# Patient Record
Sex: Female | Born: 1950 | Race: White | Hispanic: No | Marital: Married | Smoking: Never smoker
Health system: Southern US, Community
[De-identification: ages and names within clinical notes are randomized; demographics above are authoritative.]

## PROBLEM LIST (undated history)

## (undated) DIAGNOSIS — G4700 Insomnia, unspecified: Secondary | ICD-10-CM

## (undated) DIAGNOSIS — E785 Hyperlipidemia, unspecified: Secondary | ICD-10-CM

## (undated) HISTORY — DX: Insomnia, unspecified: G47.00

## (undated) HISTORY — DX: Hyperlipidemia, unspecified: E78.5

## (undated) HISTORY — PX: FACIAL COSMETIC SURGERY: SHX629

## (undated) HISTORY — PX: CATARACT EXTRACTION: SUR2

## (undated) HISTORY — PX: ABDOMINAL HYSTERECTOMY: SHX81

## (undated) HISTORY — PX: BREAST BIOPSY: SHX20

## (undated) HISTORY — PX: BUNIONECTOMY: SHX129

## (undated) HISTORY — PX: PARS PLANA VITRECTOMY W/ REPAIR OF MACULAR HOLE: SHX2170

---

## 2008-06-06 LAB — HM DEXA SCAN

## 2017-04-03 LAB — HM COLONOSCOPY

## 2017-08-22 DIAGNOSIS — Z961 Presence of intraocular lens: Secondary | ICD-10-CM | POA: Insufficient documentation

## 2017-08-22 DIAGNOSIS — H35341 Macular cyst, hole, or pseudohole, right eye: Secondary | ICD-10-CM | POA: Insufficient documentation

## 2018-09-13 LAB — LIPID PANEL
Cholesterol: 196 (ref 0–200)
HDL: 84 — AB (ref 35–70)
LDL Cholesterol: 99
Triglycerides: 66 (ref 40–160)

## 2018-09-13 LAB — HEPATIC FUNCTION PANEL
ALT: 19 (ref 7–35)
AST: 17 (ref 13–35)

## 2018-09-13 LAB — BASIC METABOLIC PANEL
BUN: 26 — AB (ref 4–21)
Creatinine: 0.6 (ref 0.5–1.1)

## 2018-09-13 LAB — CBC AND DIFFERENTIAL
HCT: 41 (ref 36–46)
Hemoglobin: 13.7 (ref 12.0–16.0)
Platelets: 277 (ref 150–399)
WBC: 6.8

## 2019-01-07 ENCOUNTER — Other Ambulatory Visit: Payer: Self-pay | Admitting: Internal Medicine

## 2019-01-07 DIAGNOSIS — Z1231 Encounter for screening mammogram for malignant neoplasm of breast: Secondary | ICD-10-CM

## 2019-02-07 ENCOUNTER — Other Ambulatory Visit: Payer: Self-pay

## 2019-02-07 ENCOUNTER — Encounter (INDEPENDENT_AMBULATORY_CARE_PROVIDER_SITE_OTHER): Payer: Self-pay

## 2019-02-07 ENCOUNTER — Ambulatory Visit
Admission: RE | Admit: 2019-02-07 | Discharge: 2019-02-07 | Disposition: A | Discharge status: Home / Self Care | Payer: Medicare Other | Source: Ambulatory Visit | Attending: Internal Medicine | Admitting: Internal Medicine

## 2019-02-07 DIAGNOSIS — Z1231 Encounter for screening mammogram for malignant neoplasm of breast: Secondary | ICD-10-CM | POA: Diagnosis not present

## 2019-08-06 ENCOUNTER — Encounter: Payer: Self-pay | Admitting: Internal Medicine

## 2019-08-09 ENCOUNTER — Ambulatory Visit (INDEPENDENT_AMBULATORY_CARE_PROVIDER_SITE_OTHER): Payer: Medicare Other | Admitting: Internal Medicine

## 2019-08-09 ENCOUNTER — Other Ambulatory Visit: Payer: Self-pay

## 2019-08-09 ENCOUNTER — Encounter: Payer: Self-pay | Admitting: Internal Medicine

## 2019-08-09 ENCOUNTER — Ambulatory Visit
Admission: RE | Admit: 2019-08-09 | Discharge: 2019-08-09 | Disposition: A | Discharge status: Home / Self Care | Payer: Medicare Other | Attending: Internal Medicine | Admitting: Internal Medicine

## 2019-08-09 ENCOUNTER — Ambulatory Visit
Admission: RE | Admit: 2019-08-09 | Discharge: 2019-08-09 | Disposition: A | Discharge status: Home / Self Care | Payer: Medicare Other | Source: Ambulatory Visit | Attending: Internal Medicine | Admitting: Internal Medicine

## 2019-08-09 ENCOUNTER — Other Ambulatory Visit: Payer: Self-pay | Admitting: Internal Medicine

## 2019-08-09 VITALS — BP 154/92 | HR 80 | Ht <= 58 in | Wt 123.0 lb

## 2019-08-09 DIAGNOSIS — R030 Elevated blood-pressure reading, without diagnosis of hypertension: Secondary | ICD-10-CM

## 2019-08-09 DIAGNOSIS — M180 Bilateral primary osteoarthritis of first carpometacarpal joints: Secondary | ICD-10-CM | POA: Insufficient documentation

## 2019-08-09 DIAGNOSIS — N3281 Overactive bladder: Secondary | ICD-10-CM | POA: Insufficient documentation

## 2019-08-09 DIAGNOSIS — R3915 Urgency of urination: Secondary | ICD-10-CM | POA: Diagnosis not present

## 2019-08-09 DIAGNOSIS — E782 Mixed hyperlipidemia: Secondary | ICD-10-CM | POA: Insufficient documentation

## 2019-08-09 DIAGNOSIS — M5126 Other intervertebral disc displacement, lumbar region: Secondary | ICD-10-CM | POA: Diagnosis not present

## 2019-08-09 LAB — POCT URINALYSIS DIPSTICK
Bilirubin, UA: NEGATIVE
Blood, UA: NEGATIVE
Glucose, UA: NEGATIVE
Ketones, UA: NEGATIVE
Leukocytes, UA: NEGATIVE
Nitrite, UA: NEGATIVE
Protein, UA: NEGATIVE
Spec Grav, UA: 1.015 (ref 1.010–1.025)
Urobilinogen, UA: 0.2 E.U./dL
pH, UA: 5 (ref 5.0–8.0)

## 2019-08-09 NOTE — Progress Notes (Signed)
Date:  08/09/2019   Name:  Erin Yu   DOB:  [birth year]   MRN:  [id number]   Chief Complaint: Establish Care, Joint Pain (Joint pains over body. Wrists are the worst. X 1 year. Hand splint helped in past but last 2 months has been worse. ), Urinary Urgency (X 2 months. Sometimes cannot make it to the bathroom on time. ), and Armpit pain (Right armpit lymph node is painful x 1 week.)  Wrist Pain  The pain is present in the left wrist and right wrist (at the base of the thumbs). This is a chronic problem. The current episode started more than 1 year ago. The problem occurs daily. The problem has been unchanged. The pain is mild. Pertinent negatives include no fever, inability to bear weight or tingling. The symptoms are aggravated by activity. She has tried rest for the symptoms. The treatment provided moderate relief.  Urinary Frequency  This is a new problem. The current episode started more than 1 month ago. The problem has been unchanged. The patient is experiencing no pain. There has been no fever. Associated symptoms include frequency and urgency. Pertinent negatives include no chills.  Hyperlipidemia This is a chronic problem. The problem is controlled. Pertinent negatives include no chest pain or shortness of breath. Current antihyperlipidemic treatment includes statins. The current treatment provides significant improvement of lipids. There are no known risk factors for coronary artery disease.    Review of Systems  Constitutional: Negative for chills, fatigue and fever.  Respiratory: Negative for cough, chest tightness, shortness of breath and wheezing.   Cardiovascular: Negative for chest pain, palpitations and leg swelling.  Gastrointestinal: Negative for abdominal pain.  Genitourinary: Positive for frequency and urgency. Negative for dysuria.  Musculoskeletal: Negative for arthralgias.  Skin: Negative for color change and rash.  Neurological: Negative for dizziness, tingling  and headaches.  Hematological: Negative for adenopathy.  Psychiatric/Behavioral: Negative for sleep disturbance.    Patient Active Problem List   Diagnosis Date Noted  . 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 Pseudophakia of both eyes 08/22/2017    No Known Allergies  Past Surgical History:  Procedure Laterality Date  . ABDOMINAL HYSTERECTOMY    . BREAST BIOPSY Bilateral    neg  . BUNIONECTOMY    . CATARACT EXTRACTION    . CESAREAN SECTION    . FACIAL COSMETIC SURGERY    . PARS PLANA VITRECTOMY W/ REPAIR OF MACULAR HOLE      Social History   Tobacco Use  . Smoking status: Never Smoker  . Smokeless tobacco: Never Used  Vaping Use  . Vaping Use: Never used  Substance Use Topics  . Alcohol use: Yes    Comment: social  . Drug use: Never     Medication list has been reviewed and updated.  Current Meds  Medication Sig  . Ascorbic Acid (VITAMIN C) 1000 MG tablet Take 1,000 mg by mouth daily.  Marland Kitchen atorvastatin (LIPITOR) 10 MG tablet Take 1 tablet by mouth daily.   . Multiple Vitamins-Minerals (PRESERVISION AREDS 2+MULTI VIT PO) Take by mouth.  . Nutritional Supplements (VITAMIN D BOOSTER PO) Take by mouth.  . traZODone (DESYREL) 150 MG tablet Take 300 mg by mouth at bedtime.     PHQ 2/9 Scores 08/09/2019  PHQ - 2 Score 0  PHQ- 9 Score 0    GAD 7 : Generalized Anxiety Score 08/09/2019  Nervous, Anxious, on Edge 0  Control/stop worrying 0  Worry too much -  different things 0  Trouble relaxing 0  Restless 0  Easily annoyed or irritable 0  Afraid - awful might happen 0  Total GAD 7 Score 0  Anxiety Difficulty Not difficult at all    BP Readings from Last 3 Encounters:  08/09/19 (!) 154/92    Physical Exam Vitals and nursing note reviewed.  Constitutional:      General: She is not in acute distress.    Appearance: She is well-developed.  HENT:     Head: Normocephalic and atraumatic.  Neck:     Vascular: No carotid bruit or JVD.  Cardiovascular:      Rate and Rhythm: Normal rate and regular rhythm.  No extrasystoles are present.    Pulses: Normal pulses.     Heart sounds: Normal heart sounds. No murmur heard.      Comments: Disparate BP L>R Pulmonary:     Effort: Pulmonary effort is normal. No respiratory distress.     Breath sounds: Normal breath sounds. No wheezing or rhonchi.  Chest:     Chest wall: No edema.     Breasts:        Right: No mass, nipple discharge, skin change or tenderness.        Left: No mass, nipple discharge, skin change or tenderness.     Comments: Tenderness in right axilla - no LAN Abdominal:     General: Abdomen is flat.     Palpations: Abdomen is soft.     Tenderness: There is no abdominal tenderness.  Musculoskeletal:        General: Normal range of motion.     Cervical back: Normal range of motion.     Right lower leg: No edema.     Left lower leg: No edema.     Comments: Negative tinel's and phalen's Mild atrophy of both thenar regions Mild tenderness at the base of both thumbs - no snuff box tenderness  Lymphadenopathy:     Cervical: No cervical adenopathy.  Skin:    General: Skin is warm and dry.     Capillary Refill: Capillary refill takes less than 2 seconds.     Findings: No rash.  Neurological:     General: No focal deficit present.     Mental Status: She is alert and oriented to person, place, and time.     Gait: Gait normal.  Psychiatric:        Mood and Affect: Mood normal.        Behavior: Behavior normal.     Wt Readings from Last 3 Encounters:  08/09/19 123 lb (55.8 kg)   BP right sitting 142/88   Supine 134/84 BP left sitting 160/100   Supine 170/110 BP (!) 154/92 (BP Location: Right Arm, Patient Position: Sitting, Cuff Size: Normal)   Pulse 80   Ht 4\' 10"  (1.473 m)   Wt 123 lb (55.8 kg)   SpO2 96%   BMI 25.71 kg/m   Assessment and Plan: 1. Elevated blood pressure reading Left readings consistently higher than right however radial pulses are equal Will get CXR -  if abnormal will refer Pt will monitor BP at home and follow up in 2 weeks - DG Chest 2 View; Future  2. Primary osteoarthritis of both first carpometacarpal joints Pt is reassured - wear splints when working Otherwise Advil or Tylenol as needed  3. HNP (herniated nucleus pulposus), lumbar Long-standing low back pain which is mild and intermittent  4. Urinary urgency UA is negative - suspect  OAB - POCT urinalysis dipstick  5. OAB (overactive bladder) Recommend frequent rest room breaks and avoid prolonging rest room visits Pt is not interested in medication at this time.  6. Mixed hyperlipidemia On statin therapy   Partially dictated using Animal nutritionist. Any errors are unintentional.  Bari Edward, MD [facility]  08/09/2019

## 2019-08-09 NOTE — Patient Instructions (Signed)
Get Omron or Sunbeam automatic cuff and start checking BP daily in each arm and record.   DASH Eating Plan DASH stands for "Dietary Approaches to Stop Hypertension." The DASH eating plan is a healthy eating plan that has been shown to reduce high blood pressure (hypertension). It may also reduce your risk for type 2 diabetes, heart disease, and stroke. The DASH eating plan may also help with weight loss. What are tips for following this plan?  General guidelines  Avoid eating more than 2,300 mg (milligrams) of salt (sodium) a day. If you have hypertension, you may need to reduce your sodium intake to 1,500 mg a day.  Limit alcohol intake to no more than 1 drink a day for nonpregnant women and 2 drinks a day for men. One drink equals 12 oz of beer, 5 oz of wine, or 1 oz of hard liquor.  Work with your health care provider to maintain a healthy body weight or to lose weight. Ask what an ideal weight is for you.  Get at least 30 minutes of exercise that causes your heart to beat faster (aerobic exercise) most days of the week. Activities may include walking, swimming, or biking.  Work with your health care provider or diet and nutrition specialist (dietitian) to adjust your eating plan to your individual calorie needs. Reading food labels   Check food labels for the amount of sodium per serving. Choose foods with less than 5 percent of the Daily Value of sodium. Generally, foods with less than 300 mg of sodium per serving fit into this eating plan.  To find whole grains, look for the word "whole" as the first word in the ingredient list. Shopping  Buy products labeled as "low-sodium" or "no salt added."  Buy fresh foods. Avoid canned foods and premade or frozen meals. Cooking  Avoid adding salt when cooking. Use salt-free seasonings or herbs instead of table salt or sea salt. Check with your health care provider or pharmacist before using salt substitutes.  Do not fry foods. Cook foods  using healthy methods such as baking, boiling, grilling, and broiling instead.  Cook with heart-healthy oils, such as olive, canola, soybean, or sunflower oil. Meal planning  Eat a balanced diet that includes: ? 5 or more servings of fruits and vegetables each day. At each meal, try to fill half of your plate with fruits and vegetables. ? Up to 6-8 servings of whole grains each day. ? Less than 6 oz of lean meat, poultry, or fish each day. A 3-oz serving of meat is about the same size as a deck of cards. One egg equals 1 oz. ? 2 servings of low-fat dairy each day. ? A serving of nuts, seeds, or beans 5 times each week. ? Heart-healthy fats. Healthy fats called Omega-3 fatty acids are found in foods such as flaxseeds and coldwater fish, like sardines, salmon, and mackerel.  Limit how much you eat of the following: ? Canned or prepackaged foods. ? Food that is high in trans fat, such as fried foods. ? Food that is high in saturated fat, such as fatty meat. ? Sweets, desserts, sugary drinks, and other foods with added sugar. ? Full-fat dairy products.  Do not salt foods before eating.  Try to eat at least 2 vegetarian meals each week.  Eat more home-cooked food and less restaurant, buffet, and fast food.  When eating at a restaurant, ask that your food be prepared with less salt or no salt, if possible.  What foods are recommended? The items listed may not be a complete list. Talk with your dietitian about what dietary choices are best for you. Grains Whole-grain or whole-wheat bread. Whole-grain or whole-wheat pasta. Brown rice. Erin Yu. Bulgur. Whole-grain and low-sodium cereals. Pita bread. Low-fat, low-sodium crackers. Whole-wheat flour tortillas. Vegetables Fresh or frozen vegetables (raw, steamed, roasted, or grilled). Low-sodium or reduced-sodium tomato and vegetable juice. Low-sodium or reduced-sodium tomato sauce and tomato paste. Low-sodium or reduced-sodium canned  vegetables. Fruits All fresh, dried, or frozen fruit. Canned fruit in natural juice (without added sugar). Meat and other protein foods Skinless chicken or [place]. Ground chicken or [place]. Pork with fat trimmed off. Fish and seafood. Egg whites. Dried beans, peas, or lentils. Unsalted nuts, nut butters, and seeds. Unsalted canned beans. Lean cuts of beef with fat trimmed off. Low-sodium, lean deli meat. Dairy Low-fat (1%) or fat-free (skim) milk. Fat-free, low-fat, or reduced-fat cheeses. Nonfat, low-sodium ricotta or cottage cheese. Low-fat or nonfat yogurt. Low-fat, low-sodium cheese. Fats and oils Soft margarine without trans fats. Vegetable oil. Low-fat, reduced-fat, or light mayonnaise and salad dressings (reduced-sodium). Canola, safflower, olive, soybean, and sunflower oils. Avocado. Seasoning and other foods Herbs. Spices. Seasoning mixes without salt. Unsalted popcorn and pretzels. Fat-free sweets. What foods are not recommended? The items listed may not be a complete list. Talk with your dietitian about what dietary choices are best for you. Grains Baked goods made with fat, such as croissants, muffins, or some breads. Dry pasta or rice meal packs. Vegetables Creamed or fried vegetables. Vegetables in a cheese sauce. Regular canned vegetables (not low-sodium or reduced-sodium). Regular canned tomato sauce and paste (not low-sodium or reduced-sodium). Regular tomato and vegetable juice (not low-sodium or reduced-sodium). Erin Yu. Olives. Fruits Canned fruit in a light or heavy syrup. Fried fruit. Fruit in cream or butter sauce. Meat and other protein foods Fatty cuts of meat. Ribs. Fried meat. Erin Yu. Sausage. Bologna and other processed lunch meats. Salami. Fatback. Hotdogs. Bratwurst. Salted nuts and seeds. Canned beans with added salt. Canned or smoked fish. Whole eggs or egg yolks. Chicken or [place] with skin. Dairy Whole or 2% milk, cream, and half-and-half. Whole or full-fat  cream cheese. Whole-fat or sweetened yogurt. Full-fat cheese. Nondairy creamers. Whipped toppings. Processed cheese and cheese spreads. Fats and oils Butter. Stick margarine. Lard. Shortening. Ghee. Bacon fat. Tropical oils, such as coconut, palm kernel, or palm oil. Seasoning and other foods Salted popcorn and pretzels. Onion salt, garlic salt, seasoned salt, table salt, and sea salt. Worcestershire sauce. Tartar sauce. Barbecue sauce. Teriyaki sauce. Soy sauce, including reduced-sodium. Steak sauce. Canned and packaged gravies. Fish sauce. Oyster sauce. Cocktail sauce. Horseradish that you find on the shelf. Ketchup. Mustard. Meat flavorings and tenderizers. Bouillon cubes. Hot sauce and Tabasco sauce. Premade or packaged marinades. Premade or packaged taco seasonings. Relishes. Regular salad dressings. Where to find more information:  National Heart, Lung, and [place]: https://wilson-eaton.com/  American Heart Association: www.heart.org Summary  The DASH eating plan is a healthy eating plan that has been shown to reduce high blood pressure (hypertension). It may also reduce your risk for type 2 diabetes, heart disease, and stroke.  With the DASH eating plan, you should limit salt (sodium) intake to 2,300 mg a day. If you have hypertension, you may need to reduce your sodium intake to 1,500 mg a day.  When on the DASH eating plan, aim to eat more fresh fruits and vegetables, whole grains, lean proteins, low-fat dairy, and heart-healthy fats.  Work  with your health care provider or diet and nutrition specialist (dietitian) to adjust your eating plan to your individual calorie needs. This information is not intended to replace advice given to you by your health care provider. Make sure you discuss any questions you have with your health care provider. Document Revised: 12/26/2016 Document Reviewed: 01/07/2016 Elsevier Patient Education  2020 [employer].

## 2019-08-23 ENCOUNTER — Other Ambulatory Visit: Payer: Self-pay

## 2019-08-23 ENCOUNTER — Encounter: Payer: Self-pay | Admitting: Internal Medicine

## 2019-08-23 ENCOUNTER — Ambulatory Visit (INDEPENDENT_AMBULATORY_CARE_PROVIDER_SITE_OTHER): Payer: Medicare Other | Admitting: Internal Medicine

## 2019-08-23 VITALS — BP 124/72 | HR 90 | Temp 98.2°F | Ht <= 58 in | Wt 123.0 lb

## 2019-08-23 DIAGNOSIS — Z23 Encounter for immunization: Secondary | ICD-10-CM | POA: Diagnosis not present

## 2019-08-23 DIAGNOSIS — E782 Mixed hyperlipidemia: Secondary | ICD-10-CM

## 2019-08-23 DIAGNOSIS — R030 Elevated blood-pressure reading, without diagnosis of hypertension: Secondary | ICD-10-CM | POA: Diagnosis not present

## 2019-08-23 NOTE — Progress Notes (Signed)
Date:  08/23/2019   Name:  Erin Yu   DOB:  [birth year]   MRN:  [id number]   Chief Complaint: Hypertension ([id number]- follow up BP in both arms- last reading this morning 123/73 right 122/76  73)  Hypertension This is a new problem. The problem is controlled (BP at home have been normal - occasional reading 140/80). Pertinent negatives include no chest pain, headaches, palpitations or shortness of breath. Risk factors for coronary artery disease include dyslipidemia. Past treatments include nothing.    Review of Systems  Constitutional: Negative for appetite change and fatigue.  Eyes: Negative for visual disturbance.  Respiratory: Negative for chest tightness and shortness of breath.   Cardiovascular: Negative for chest pain, palpitations and leg swelling.  Gastrointestinal: Negative for abdominal pain.  Endocrine: Negative for polydipsia and polyuria.  Musculoskeletal: Positive for arthralgias.  Neurological: Negative for dizziness, tremors, numbness and headaches.  Psychiatric/Behavioral: Negative for dysphoric mood.    Patient Active Problem List   Diagnosis Date Noted  . HNP (herniated nucleus pulposus), lumbar 08/09/2019  . Primary osteoarthritis of both first carpometacarpal joints 08/09/2019  . OAB (overactive bladder) 08/09/2019  . Mixed hyperlipidemia 08/09/2019  . Elevated blood pressure reading 08/09/2019  . 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 Pseudophakia of both eyes 08/22/2017    No Known Allergies  Past Surgical History:  Procedure Laterality Date  . ABDOMINAL HYSTERECTOMY    . BREAST BIOPSY Bilateral    neg  . BUNIONECTOMY    . CATARACT EXTRACTION    . CESAREAN SECTION    . FACIAL COSMETIC SURGERY    . PARS PLANA VITRECTOMY W/ REPAIR OF MACULAR HOLE      Social History   Tobacco Use  . Smoking status: Never Smoker  . Smokeless tobacco: Never Used  Vaping Use  . Vaping Use: Never used  Substance Use Topics  . Alcohol use: Yes    Comment:  social  . Drug use: Never     Medication list has been reviewed and updated.  Current Meds  Medication Sig  . Ascorbic Acid (VITAMIN C) 1000 MG tablet Take 1,000 mg by mouth daily.  Marland Kitchen atorvastatin (LIPITOR) 10 MG tablet Take 1 tablet by mouth daily.   . Multiple Vitamins-Minerals (PRESERVISION AREDS 2+MULTI VIT PO) Take by mouth.  . Nutritional Supplements (VITAMIN D BOOSTER PO) Take by mouth.  . traZODone (DESYREL) 150 MG tablet Take 300 mg by mouth at bedtime.     PHQ 2/9 Scores 08/23/2019 08/09/2019  PHQ - 2 Score 0 0  PHQ- 9 Score 1 0    GAD 7 : Generalized Anxiety Score 08/23/2019 08/09/2019  Nervous, Anxious, on Edge 0 0  Control/stop worrying 0 0  Worry too much - different things 0 0  Trouble relaxing 0 0  Restless 0 0  Easily annoyed or irritable 0 0  Afraid - awful might happen 0 0  Total GAD 7 Score 0 0  Anxiety Difficulty Not difficult at all Not difficult at all    BP Readings from Last 3 Encounters:  08/23/19 124/72  08/09/19 (!) 154/92    Physical Exam Vitals and nursing note reviewed.  Constitutional:      General: She is not in acute distress.    Appearance: Normal appearance. She is well-developed.  HENT:     Head: Normocephalic and atraumatic.  Cardiovascular:     Rate and Rhythm: Normal rate and regular rhythm.     Heart sounds: No murmur heard.  Pulmonary:     Effort: Pulmonary effort is normal. No respiratory distress.     Breath sounds: No wheezing or rhonchi.  Musculoskeletal:     Cervical back: Normal range of motion.     Right lower leg: No edema.     Left lower leg: No edema.  Skin:    General: Skin is warm and dry.     Capillary Refill: Capillary refill takes less than 2 seconds.     Findings: No rash.  Neurological:     General: No focal deficit present.     Mental Status: She is alert and oriented to person, place, and time.  Psychiatric:        Mood and Affect: Mood normal.     Wt Readings from Last 3 Encounters:    08/23/19 123 lb (55.8 kg)  08/09/19 123 lb (55.8 kg)    BP 124/72 (BP Location: Left Leg)   Pulse 90   Temp 98.2 F (36.8 C) (Oral)   Ht 4\' 10"  (1.473 m)   Wt 123 lb (55.8 kg)   SpO2 97%   BMI 25.71 kg/m   Assessment and Plan: 1. Elevated blood pressure reading Normalized without intervention Continue to monitor several times per month at home  2. Mixed hyperlipidemia Continue statin therapy  Labs at next visit  3. Need for vaccination for pneumococcus - Pneumococcal conjugate vaccine 13-valent IM   Partially dictated using . Any errors are unintentional.  Animal nutritionist, MD [facility]  08/23/2019

## 2019-10-18 ENCOUNTER — Other Ambulatory Visit
Admission: RE | Admit: 2019-10-18 | Discharge: 2019-10-18 | Disposition: A | Discharge status: Home / Self Care | Payer: Medicare Other | Attending: Internal Medicine | Admitting: Internal Medicine

## 2019-10-18 ENCOUNTER — Encounter: Payer: Self-pay | Admitting: Internal Medicine

## 2019-10-18 ENCOUNTER — Other Ambulatory Visit: Payer: Self-pay

## 2019-10-18 ENCOUNTER — Ambulatory Visit (INDEPENDENT_AMBULATORY_CARE_PROVIDER_SITE_OTHER): Payer: Medicare Other | Admitting: Internal Medicine

## 2019-10-18 VITALS — BP 108/64 | HR 94 | Ht <= 58 in | Wt 122.0 lb

## 2019-10-18 DIAGNOSIS — Z23 Encounter for immunization: Secondary | ICD-10-CM

## 2019-10-18 DIAGNOSIS — F5101 Primary insomnia: Secondary | ICD-10-CM | POA: Insufficient documentation

## 2019-10-18 DIAGNOSIS — R030 Elevated blood-pressure reading, without diagnosis of hypertension: Secondary | ICD-10-CM

## 2019-10-18 DIAGNOSIS — E782 Mixed hyperlipidemia: Secondary | ICD-10-CM | POA: Insufficient documentation

## 2019-10-18 DIAGNOSIS — N3281 Overactive bladder: Secondary | ICD-10-CM | POA: Diagnosis not present

## 2019-10-18 DIAGNOSIS — E2839 Other primary ovarian failure: Secondary | ICD-10-CM

## 2019-10-18 DIAGNOSIS — Z1231 Encounter for screening mammogram for malignant neoplasm of breast: Secondary | ICD-10-CM

## 2019-10-18 LAB — CBC WITH DIFFERENTIAL/PLATELET
Abs Immature Granulocytes: 0.02 10*3/uL (ref 0.00–0.07)
Basophils Absolute: 0.1 10*3/uL (ref 0.0–0.1)
Basophils Relative: 2 %
Eosinophils Absolute: 0.2 10*3/uL (ref 0.0–0.5)
Eosinophils Relative: 4 %
HCT: 44.6 % (ref 36.0–46.0)
Hemoglobin: 15 g/dL (ref 12.0–15.0)
Immature Granulocytes: 1 %
Lymphocytes Relative: 34 %
Lymphs Abs: 1.4 10*3/uL (ref 0.7–4.0)
MCH: 31.1 pg (ref 26.0–34.0)
MCHC: 33.6 g/dL (ref 30.0–36.0)
MCV: 92.3 fL (ref 80.0–100.0)
Monocytes Absolute: 0.4 10*3/uL (ref 0.1–1.0)
Monocytes Relative: 10 %
Neutro Abs: 2.2 10*3/uL (ref 1.7–7.7)
Neutrophils Relative %: 49 %
Platelets: 305 10*3/uL (ref 150–400)
RBC: 4.83 MIL/uL (ref 3.87–5.11)
RDW: 13.1 % (ref 11.5–15.5)
WBC: 4.3 10*3/uL (ref 4.0–10.5)
nRBC: 0 % (ref 0.0–0.2)

## 2019-10-18 LAB — COMPREHENSIVE METABOLIC PANEL
ALT: 21 U/L (ref 0–44)
AST: 17 U/L (ref 15–41)
Albumin: 4.8 g/dL (ref 3.5–5.0)
Alkaline Phosphatase: 45 U/L (ref 38–126)
Anion gap: 8 (ref 5–15)
BUN: 22 mg/dL (ref 8–23)
CO2: 27 mmol/L (ref 22–32)
Calcium: 9.1 mg/dL (ref 8.9–10.3)
Chloride: 104 mmol/L (ref 98–111)
Creatinine, Ser: 0.62 mg/dL (ref 0.44–1.00)
GFR calc Af Amer: >60 mL/min (ref >60)
GFR calc non Af Amer: >60 mL/min (ref >60)
Glucose, Bld: 106 mg/dL — ABNORMAL HIGH (ref 70–99)
Potassium: 4.6 mmol/L (ref 3.5–5.1)
Sodium: 139 mmol/L (ref 135–145)
Total Bilirubin: 0.9 mg/dL (ref 0.3–1.2)
Total Protein: 7.7 g/dL (ref 6.5–8.1)

## 2019-10-18 LAB — POCT URINALYSIS DIPSTICK
Bilirubin, UA: NEGATIVE
Blood, UA: NEGATIVE
Glucose, UA: NEGATIVE
Ketones, UA: NEGATIVE
Leukocytes, UA: NEGATIVE
Nitrite, UA: NEGATIVE
Protein, UA: NEGATIVE
Spec Grav, UA: 1.01 (ref 1.010–1.025)
Urobilinogen, UA: 0.2 E.U./dL
pH, UA: 5 (ref 5.0–8.0)

## 2019-10-18 LAB — LIPID PANEL
Cholesterol: 236 mg/dL — ABNORMAL HIGH (ref 0–200)
HDL: 87 mg/dL (ref >40)
LDL Cholesterol: 140 mg/dL — ABNORMAL HIGH (ref 0–99)
Total CHOL/HDL Ratio: 2.7 RATIO
Triglycerides: 43 mg/dL (ref <150)
VLDL: 9 mg/dL (ref 0–40)

## 2019-10-18 MED ORDER — ATORVASTATIN CALCIUM 10 MG PO TABS: 10.0000 mg | ORAL_TABLET | Freq: Every day | ORAL | 3 refills | Status: DC

## 2019-10-18 MED ORDER — TRAZODONE HCL 150 MG PO TABS: 300.0000 mg | ORAL_TABLET | Freq: Every day | ORAL | 3 refills | Status: DC

## 2019-10-18 NOTE — Patient Instructions (Signed)
[facility] - [place] office  [facility] [place] Or [facility] in [place]

## 2019-10-18 NOTE — Progress Notes (Signed)
Date:  10/18/2019   Name:  Erin Yu   DOB:  1951/01/26   MRN:  [id number]   Chief Complaint: Annual Exam (Declined breast exam. No pap- aged out. ) and Immunizations (high dose flu shot)  Erin Yu is a 69 y.o. female who presents today for her Annual Exam. She feels well. She reports exercising - walking. She reports she is sleeping fairly well. Breast complaints - none.  Mammogram: 01/2019 DEXA: 05/2008 Pap smear: aged out Colonoscopy: 03/2017  Immunization History  Administered Date(s) Administered  . Influenza, High Dose Seasonal PF 10/30/2017  . Influenza-Unspecified 10/08/2018  . PFIZER SARS-COV-2 Vaccination 03/09/2019, 03/30/2019  . Pneumococcal Conjugate-13 08/23/2019  . Pneumococcal Polysaccharide-23 10/30/2017  . Tdap 08/17/2015  . Zoster Recombinat (Shingrix) 08/30/2017, 12/27/2017    Hyperlipidemia This is a chronic problem. The problem is controlled. Pertinent negatives include no chest pain or shortness of breath. Current antihyperlipidemic treatment includes statins. The current treatment provides significant improvement of lipids. There are no compliance problems.   Insomnia Primary symptoms: sleep disturbance.  The problem occurs nightly.    Lab Results  Component Value Date   CREATININE 0.6 09/13/2018   BUN 26 (A) 09/13/2018   Lab Results  Component Value Date   CHOL 196 09/13/2018   HDL 84 (A) 09/13/2018   LDLCALC 99 09/13/2018   TRIG 66 09/13/2018   No results found for: TSH No results found for: HGBA1C Lab Results  Component Value Date   WBC 6.8 09/13/2018   HGB 13.7 09/13/2018   HCT 41 09/13/2018   PLT 277 09/13/2018   Lab Results  Component Value Date   ALT 19 09/13/2018   AST 17 09/13/2018     Review of Systems  Constitutional: Negative for chills, fatigue and fever.  HENT: Negative for congestion, hearing loss, tinnitus, trouble swallowing and voice change.   Eyes: Negative for visual disturbance.  Respiratory: Negative  for cough, chest tightness, shortness of breath and wheezing.   Cardiovascular: Negative for chest pain, palpitations and leg swelling.  Gastrointestinal: Negative for abdominal pain, constipation, diarrhea and vomiting.  Endocrine: Negative for polydipsia and polyuria.  Genitourinary: Positive for frequency. Negative for dysuria, genital sores, vaginal bleeding and vaginal discharge.  Musculoskeletal: Positive for arthralgias. Negative for gait problem and joint swelling.  Skin: Negative for color change and rash.  Neurological: Negative for dizziness, tremors, light-headedness and headaches.  Hematological: Negative for adenopathy. Does not bruise/bleed easily.  Psychiatric/Behavioral: Positive for sleep disturbance. Negative for dysphoric mood. The patient has insomnia. The patient is not nervous/anxious.     Patient Active Problem List   Diagnosis Date Noted  . HNP (herniated nucleus pulposus), lumbar 08/09/2019  . Primary osteoarthritis of both first carpometacarpal joints 08/09/2019  . OAB (overactive bladder) 08/09/2019  . Mixed hyperlipidemia 08/09/2019  . Elevated blood pressure reading 08/09/2019  . 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 Pseudophakia of both eyes 08/22/2017    No Known Allergies  Past Surgical History:  Procedure Laterality Date  . ABDOMINAL HYSTERECTOMY    . BREAST BIOPSY Bilateral    neg  . BUNIONECTOMY    . CATARACT EXTRACTION    . CESAREAN SECTION    . FACIAL COSMETIC SURGERY    . PARS PLANA VITRECTOMY W/ REPAIR OF MACULAR HOLE      Social History   Tobacco Use  . Smoking status: Never Smoker  . Smokeless tobacco: Never Used  Vaping Use  . Vaping Use: Never used  Substance  Use Topics  . Alcohol use: Yes    Comment: social  . Drug use: Never     Medication list has been reviewed and updated.  Current Meds  Medication Sig  . Ascorbic Acid (VITAMIN C) 1000 MG tablet Take 1,000 mg by mouth daily.  Marland Kitchen atorvastatin (LIPITOR) 10 MG  tablet Take 1 tablet by mouth daily.   . Multiple Vitamins-Minerals (PRESERVISION AREDS 2+MULTI VIT PO) Take by mouth.  . Nutritional Supplements (VITAMIN D BOOSTER PO) Take by mouth.  . traZODone (DESYREL) 150 MG tablet Take 300 mg by mouth at bedtime.     PHQ 2/9 Scores 08/23/2019 08/09/2019  PHQ - 2 Score 0 0  PHQ- 9 Score 1 0    GAD 7 : Generalized Anxiety Score 08/23/2019 08/09/2019  Nervous, Anxious, on Edge 0 0  Control/stop worrying 0 0  Worry too much - different things 0 0  Trouble relaxing 0 0  Restless 0 0  Easily annoyed or irritable 0 0  Afraid - awful might happen 0 0  Total GAD 7 Score 0 0  Anxiety Difficulty Not difficult at all Not difficult at all    BP Readings from Last 3 Encounters:  10/18/19 108/64  08/23/19 124/72  08/09/19 (!) 154/92    Physical Exam Vitals and nursing note reviewed.  Constitutional:      General: She is not in acute distress.    Appearance: She is well-developed.  HENT:     Head: Normocephalic and atraumatic.     Right Ear: Tympanic membrane and ear canal normal.     Left Ear: Tympanic membrane and ear canal normal.     Nose:     Right Sinus: No maxillary sinus tenderness.     Left Sinus: No maxillary sinus tenderness.  Eyes:     General: No scleral icterus.       Right eye: No discharge.        Left eye: No discharge.     Conjunctiva/sclera: Conjunctivae normal.  Neck:     Thyroid: No thyromegaly.     Vascular: No carotid bruit.  Cardiovascular:     Rate and Rhythm: Normal rate and regular rhythm.     Pulses: Normal pulses.     Heart sounds: Normal heart sounds.  Pulmonary:     Effort: Pulmonary effort is normal. No respiratory distress.     Breath sounds: No wheezing.  Abdominal:     General: Bowel sounds are normal.     Palpations: Abdomen is soft.     Tenderness: There is no abdominal tenderness.  Musculoskeletal:     Cervical back: Normal range of motion. No erythema.     Right lower leg: No edema.     Left  lower leg: No edema.  Lymphadenopathy:     Cervical: No cervical adenopathy.  Skin:    General: Skin is warm and dry.     Findings: No rash.  Neurological:     Mental Status: She is alert and oriented to person, place, and time.     Cranial Nerves: No cranial nerve deficit.     Sensory: No sensory deficit.     Deep Tendon Reflexes: Reflexes are normal and symmetric.  Psychiatric:        Attention and Perception: Attention normal.        Mood and Affect: Mood normal.     Wt Readings from Last 3 Encounters:  10/18/19 122 lb (55.3 kg)  08/23/19 123 lb (55.8 kg)  08/09/19 123 lb (55.8 kg)    BP 108/64   Pulse 94   Ht 4\' 10"  (1.473 m)   Wt 122 lb (55.3 kg)   SpO2 95%   BMI 25.50 kg/m   Assessment and Plan: 1. Mixed hyperlipidemia Tolerating statin medication without side effects at this time Continue same therapy without change at this time. - Comprehensive metabolic panel - Lipid panel - atorvastatin (LIPITOR) 10 MG tablet; Take 1 tablet (10 mg total) by mouth daily.  Dispense: 90 tablet; Refill: 3  2. Elevated blood pressure reading Readings at home are normal Continue healthy diet, exercise and intermittent home monitoring - CBC with Differential/Platelet  3. OAB (overactive bladder) - POCT urinalysis dipstick  4. Ovarian failure Due for DEXA - DG Bone Density; Future  5. Encounter for screening mammogram for breast cancer Due in January - MM 3D SCREEN BREAST BILATERAL; Future  6. Primary insomnia Doing well on current dose - will continue - traZODone (DESYREL) 150 MG tablet; Take 2 tablets (300 mg total) by mouth at bedtime.  Dispense: 180 tablet; Refill: 3   Partially dictated using February. Any errors are unintentional.  Animal nutritionist, MD [facility]  10/18/2019

## 2019-10-19 ENCOUNTER — Other Ambulatory Visit: Payer: Self-pay

## 2019-10-19 DIAGNOSIS — R7309 Other abnormal glucose: Secondary | ICD-10-CM | POA: Insufficient documentation

## 2019-10-19 DIAGNOSIS — E782 Mixed hyperlipidemia: Secondary | ICD-10-CM

## 2019-10-19 MED ORDER — ATORVASTATIN CALCIUM 20 MG PO TABS: 20.0000 mg | ORAL_TABLET | Freq: Every day | ORAL | 3 refills | Status: DC

## 2019-11-07 ENCOUNTER — Ambulatory Visit (INDEPENDENT_AMBULATORY_CARE_PROVIDER_SITE_OTHER): Payer: Medicare Other

## 2019-11-07 ENCOUNTER — Other Ambulatory Visit: Payer: Self-pay

## 2019-11-07 VITALS — BP 118/72 | HR 85 | Temp 98.1°F | Resp 16 | Ht <= 58 in | Wt 123.4 lb

## 2019-11-07 DIAGNOSIS — Z0000 Encounter for general adult medical examination without abnormal findings: Secondary | ICD-10-CM | POA: Diagnosis not present

## 2019-11-07 DIAGNOSIS — H35341 Macular cyst, hole, or pseudohole, right eye: Secondary | ICD-10-CM | POA: Diagnosis not present

## 2019-11-07 NOTE — Patient Instructions (Signed)
Erin Yu , Thank you for taking time to come for your Medicare Wellness Visit. I appreciate your ongoing commitment to your health goals. Please review the following plan we discussed and let me know if I can assist you in the future.   Screening recommendations/referrals: Colonoscopy: done 04/03/17. Repeat in 2029 Mammogram: done 02/07/19. Scheduled for 02/08/20 Bone Density: scheduled for 02/08/20 Recommended yearly ophthalmology/optometry visit for glaucoma screening and checkup Recommended yearly dental visit for hygiene and checkup  Vaccinations: Influenza vaccine: done 10/18/19 Pneumococcal vaccine: done 08/23/19 Tdap vaccine: done 08/17/15 Shingles vaccine: done 08/30/17 & 12/27/17   Covid-19:done 03/09/19 & 03/30/19  Advanced directives: Please bring a copy of your health care power of attorney and living will to the office at your convenience.  Conditions/risks identified: Keep up the great work!  Next appointment: Follow up in one year for your annual wellness visit    Preventive Care 65 Years and Older, Female Preventive care refers to lifestyle choices and visits with your health care provider that can promote health and wellness. What does preventive care include?  A yearly physical exam. This is also called an annual well check.  Dental exams once or twice a year.  Routine eye exams. Ask your health care provider how often you should have your eyes checked.  Personal lifestyle choices, including:  Daily care of your teeth and gums.  Regular physical activity.  Eating a healthy diet.  Avoiding tobacco and drug use.  Limiting alcohol use.  Practicing safe sex.  Taking low-dose aspirin every day.  Taking vitamin and mineral supplements as recommended by your health care provider. What happens during an annual well check? The services and screenings done by your health care provider during your annual well check will depend on your age, overall health, lifestyle risk  factors, and family history of disease. Counseling  Your health care provider may ask you questions about your:  Alcohol use.  Tobacco use.  Drug use.  Emotional well-being.  Home and relationship well-being.  Sexual activity.  Eating habits.  History of falls.  Memory and ability to understand (cognition).  Work and work Astronomer.  Reproductive health. Screening  You may have the following tests or measurements:  Height, weight, and BMI.  Blood pressure.  Lipid and cholesterol levels. These may be checked every 5 years, or more frequently if you are over 43 years old.  Skin check.  Lung cancer screening. You may have this screening every year starting at age 98 if you have a 30-pack-year history of smoking and currently smoke or have quit within the past 15 years.  Fecal occult blood test (FOBT) of the stool. You may have this test every year starting at age 3.  Flexible sigmoidoscopy or colonoscopy. You may have a sigmoidoscopy every 5 years or a colonoscopy every 10 years starting at age 70.  Hepatitis C blood test.  Hepatitis B blood test.  Sexually transmitted disease (STD) testing.  Diabetes screening. This is done by checking your blood sugar (glucose) after you have not eaten for a while (fasting). You may have this done every 1-3 years.  Bone density scan. This is done to screen for osteoporosis. You may have this done starting at age 11.  Mammogram. This may be done every 1-2 years. Talk to your health care provider about how often you should have regular mammograms. Talk with your health care provider about your test results, treatment options, and if necessary, the need for more tests. Vaccines  Your health care provider may recommend certain vaccines, such as:  Influenza vaccine. This is recommended every year.  Tetanus, diphtheria, and acellular pertussis (Tdap, Td) vaccine. You may need a Td booster every 10 years.  Zoster vaccine. You  may need this after age 26.  Pneumococcal 13-valent conjugate (PCV13) vaccine. One dose is recommended after age 24.  Pneumococcal polysaccharide (PPSV23) vaccine. One dose is recommended after age 3. Talk to your health care provider about which screenings and vaccines you need and how often you need them. This information is not intended to replace advice given to you by your health care provider. Make sure you discuss any questions you have with your health care provider. Document Released: 02/09/2015 Document Revised: 10/03/2015 Document Reviewed: 11/14/2014 Elsevier Interactive Patient Education  2017 [place] Prevention in the Home Falls can cause injuries. They can happen to people of all ages. There are many things you can do to make your home safe and to help prevent falls. What can I do on the outside of my home?  Regularly fix the edges of walkways and driveways and fix any cracks.  Remove anything that might make you trip as you walk through a door, such as a raised step or threshold.  Trim any bushes or trees on the path to your home.  Use bright outdoor lighting.  Clear any walking paths of anything that might make someone trip, such as rocks or tools.  Regularly check to see if handrails are loose or broken. Make sure that both sides of any steps have handrails.  Any raised decks and porches should have guardrails on the edges.  Have any leaves, snow, or ice cleared regularly.  Use sand or salt on walking paths during winter.  Clean up any spills in your garage right away. This includes oil or grease spills. What can I do in the bathroom?  Use night lights.  Install grab bars by the toilet and in the tub and shower. Do not use towel bars as grab bars.  Use non-skid mats or decals in the tub or shower.  If you need to sit down in the shower, use a plastic, non-slip stool.  Keep the floor dry. Clean up any water that spills on the floor as soon as  it happens.  Remove soap buildup in the tub or shower regularly.  Attach bath mats securely with double-sided non-slip rug tape.  Do not have throw rugs and other things on the floor that can make you trip. What can I do in the bedroom?  Use night lights.  Make sure that you have a light by your bed that is easy to reach.  Do not use any sheets or blankets that are too big for your bed. They should not hang down onto the floor.  Have a firm chair that has side arms. You can use this for support while you get dressed.  Do not have throw rugs and other things on the floor that can make you trip. What can I do in the kitchen?  Clean up any spills right away.  Avoid walking on wet floors.  Keep items that you use a lot in easy-to-reach places.  If you need to reach something above you, use a strong step stool that has a grab bar.  Keep electrical cords out of the way.  Do not use floor polish or wax that makes floors slippery. If you must use wax, use non-skid floor wax.  Do  not have throw rugs and other things on the floor that can make you trip. What can I do with my stairs?  Do not leave any items on the stairs.  Make sure that there are handrails on both sides of the stairs and use them. Fix handrails that are broken or loose. Make sure that handrails are as long as the stairways.  Check any carpeting to make sure that it is firmly attached to the stairs. Fix any carpet that is loose or worn.  Avoid having throw rugs at the top or bottom of the stairs. If you do have throw rugs, attach them to the floor with carpet tape.  Make sure that you have a light switch at the top of the stairs and the bottom of the stairs. If you do not have them, ask someone to add them for you. What else can I do to help prevent falls?  Wear shoes that:  Do not have high heels.  Have rubber bottoms.  Are comfortable and fit you well.  Are closed at the toe. Do not wear sandals.  If  you use a stepladder:  Make sure that it is fully opened. Do not climb a closed stepladder.  Make sure that both sides of the stepladder are locked into place.  Ask someone to hold it for you, if possible.  Clearly mark and make sure that you can see:  Any grab bars or handrails.  First and last steps.  Where the edge of each step is.  Use tools that help you move around (mobility aids) if they are needed. These include:  Canes.  Walkers.  Scooters.  Crutches.  Turn on the lights when you go into a dark area. Replace any light bulbs as soon as they burn out.  Set up your furniture so you have a clear path. Avoid moving your furniture around.  If any of your floors are uneven, fix them.  If there are any pets around you, be aware of where they are.  Review your medicines with your doctor. Some medicines can make you feel dizzy. This can increase your chance of falling. Ask your doctor what other things that you can do to help prevent falls. This information is not intended to replace advice given to you by your health care provider. Make sure you discuss any questions you have with your health care provider. Document Released: 11/09/2008 Document Revised: 06/21/2015 Document Reviewed: 02/17/2014 Elsevier Interactive Patient Education  2017 [employer].

## 2019-11-07 NOTE — Progress Notes (Signed)
Subjective:   Erin Yu is a 69 y.o. female who presents for an Initial Medicare Annual Wellness Visit.  Review of Systems     Cardiac Risk Factors include: advanced age (>69men, >68 women);dyslipidemia     Objective:    Today's Vitals   11/07/19 1010  BP: 118/72  Pulse: 85  Resp: 16  Temp: 98.1 F (36.7 C)  TempSrc: Oral  SpO2: 97%  Weight: 123 lb 6.4 oz (56 kg)  Height: 4\' 10"  (1.473 m)   Body mass index is 25.79 kg/m.  Advanced Directives 11/07/2019  Does Patient Have a Medical Advance Directive? Yes  Type of 01/07/2020 of [place];Living will  Copy of Healthcare Power of Attorney in Chart? No - copy requested    Current Medications (verified) Outpatient Encounter Medications as of 11/07/2019  Medication Sig  . Ascorbic Acid (VITAMIN C) 1000 MG tablet Take 1,000 mg by mouth daily.  01/07/2020 atorvastatin (LIPITOR) 20 MG tablet Take 1 tablet (20 mg total) by mouth daily.  . Multiple Vitamins-Minerals (PRESERVISION AREDS 2+MULTI VIT PO) Take by mouth.  . Multiple Vitamins-Minerals (WOMENS MULTIVITAMIN PO) Take by mouth.  . Nutritional Supplements (VITAMIN D BOOSTER PO) Take by mouth.  . traZODone (DESYREL) 150 MG tablet Take 2 tablets (300 mg total) by mouth at bedtime.   No facility-administered encounter medications on file as of 11/07/2019.    Allergies (verified) Patient has no known allergies.   History: Past Medical History:  Diagnosis Date  . Hyperlipidemia   . Insomnia    Past Surgical History:  Procedure Laterality Date  . ABDOMINAL HYSTERECTOMY    . BREAST BIOPSY Bilateral    neg  . BUNIONECTOMY    . CATARACT EXTRACTION    . CESAREAN SECTION    . FACIAL COSMETIC SURGERY    . PARS PLANA VITRECTOMY W/ REPAIR OF MACULAR HOLE     Family History  Problem Relation Age of Onset  . Depression Mother   . Hearing loss Mother   . Leukemia Father   . Heart disease Father   . Hypertension Father   . Hyperlipidemia Father   .  Diabetes Father    Social History   Socioeconomic History  . Marital status: Married    Spouse name: Not on file  . Number of children: 1  . Years of education: Not on file  . Highest education level: Not on file  Occupational History  . Not on file  Tobacco Use  . Smoking status: Never Smoker  . Smokeless tobacco: Never Used  Vaping Use  . Vaping Use: Never used  Substance and Sexual Activity  . Alcohol use: Yes    Comment: social  . Drug use: Never  . Sexual activity: Not Currently  Other Topics Concern  . Not on file  Social History Narrative  . Not on file   Social Determinants of Health   Financial Resource Strain: Low Risk   . Difficulty of Paying Living Expenses: Not hard at all  Food Insecurity: No Food Insecurity  . Worried About 01/07/2020 in the Last Year: Never true  . Ran Out of Food in the Last Year: Never true  Transportation Needs: No Transportation Needs  . Lack of Transportation (Medical): No  . Lack of Transportation (Non-Medical): No  Physical Activity: Insufficiently Active  . Days of Exercise per Week: 7 days  . Minutes of Exercise per Session: 20 min  Stress: Stress Concern Present  . Feeling of  Stress : Rather much  Social Connections: Moderately Isolated  . Frequency of Communication with Friends and Family: More than three times a week  . Frequency of Social Gatherings with Friends and Family: Once a week  . Attends Religious Services: Never  . Active Member of Clubs or Organizations: No  . Attends Banker Meetings: Never  . Marital Status: Married    Tobacco Counseling Counseling given: Not Answered   Clinical Intake:                          Activities of Daily Living In your present state of health, do you have any difficulty performing the following activities: 11/07/2019 08/09/2019  Hearing? N N  Comment declines hearing aids -  Vision? N N  Difficulty concentrating or making decisions?  N N  Walking or climbing stairs? N N  Dressing or bathing? N N  Doing errands, shopping? N N  Preparing Food and eating ? N -  Using the Toilet? N -  In the past six months, have you accidently leaked urine? N -  Do you have problems with loss of bowel control? N -  Managing your Medications? N -  Managing your Finances? N -  Housekeeping or managing your Housekeeping? N -    Patient Care Team: Reubin Milan, MD as PCP - General (Internal Medicine)  Indicate any recent Medical Services you may have received from other than [facility] providers in the past year (date may be approximate).     Assessment:   This is a routine wellness examination for Erin Yu.  Hearing/Vision screen  Hearing Screening   125Hz  250Hz  500Hz  1000Hz  2000Hz  3000Hz  4000Hz  6000Hz  8000Hz   Right ear:           Left ear:           Comments: Pt denies hearing difficulty  Vision Screening Comments: Due for annual vision screening. New to the area; referral sent to [facility]  Dietary issues and exercise activities discussed: Current Exercise Habits: Home exercise routine, Type of exercise: walking, Time (Minutes): 20, Frequency (Times/Week): 7, Weekly Exercise (Minutes/Week): 140, Intensity: Moderate, Exercise limited by: None identified  Goals    . Patient Stated     Patient states she would like to maintain current weight and activity level      Depression Screen PHQ 2/9 Scores 11/07/2019 08/23/2019 08/09/2019  PHQ - 2 Score 0 0 0  PHQ- 9 Score - 1 0    Fall Risk Fall Risk  11/07/2019 08/23/2019 08/09/2019  Falls in the past year? 0 0 0  Number falls in past yr: 0 - 0  Injury with Fall? 0 - 0  Risk for fall due to : No Fall Risks No Fall Risks No Fall Risks  Follow up Falls prevention discussed Falls evaluation completed Falls evaluation completed    Any stairs in or around the home? Yes  If so, are there any without handrails? Yes - 3 steps outside     Home free of loose throw rugs in  walkways, pet beds, electrical cords, etc? Yes  Adequate lighting in your home to reduce risk of falls? Yes   ASSISTIVE DEVICES UTILIZED TO PREVENT FALLS:  Life alert? No  Use of a cane, walker or w/c? No  Grab bars in the bathroom? No  Shower chair or bench in shower? No  Elevated toilet seat or a handicapped toilet? Yes   TIMED UP AND  GO:  Was the test performed? Yes .  Length of time to ambulate 10 feet: 5 sec.   Gait steady and fast without use of assistive device  Cognitive Function: pt declined 6CIT - feeling tired today due to being up last night with her dog        Immunizations Immunization History  Administered Date(s) Administered  . Fluad Quad(high Dose 65+) 10/18/2019  . Influenza, High Dose Seasonal PF 10/30/2017  . Influenza-Unspecified 10/08/2018  . PFIZER SARS-COV-2 Vaccination 03/09/2019, 03/30/2019  . Pneumococcal Conjugate-13 08/23/2019  . Pneumococcal Polysaccharide-23 10/30/2017  . Tdap 08/17/2015  . Zoster Recombinat (Shingrix) 08/30/2017, 12/27/2017    TDAP status: Up to date   Flu Vaccine status: Up to date   Pneumococcal vaccine status: Up to date   Covid-19 vaccine status: Completed vaccines  Qualifies for Shingles Vaccine? Yes   Zostavax completed Yes   Shingrix Completed?: Yes  Screening Tests Health Maintenance  Topic Date Due  . Hepatitis C Screening  08/22/2020 (Originally January 02, 1951)  . MAMMOGRAM  02/06/2021  . TETANUS/TDAP  08/16/2025  . COLONOSCOPY  04/04/2027  . INFLUENZA VACCINE  Completed  . DEXA SCAN  Completed  . COVID-19 Vaccine  Completed  . PNA vac Low Risk Adult  Completed    Health Maintenance  There are no preventive care reminders to display for this patient.  Colorectal cancer screening: Completed 04/03/17. Repeat every 10 years   Mammogram status: Completed 02/07/19. Repeat every year   Bone density screening status: completed 06/06/08. Results reflect unavailable. Scheduled for 02/08/20.   Lung Cancer  Screening: (Low Dose CT Chest recommended if Age 51-80 years, 30 pack-year currently smoking OR have quit w/in 15years.) does not qualify.   Additional Screening:  Hepatitis C Screening: does qualify; postponed  Vision Screening: Recommended annual ophthalmology exams for early detection of glaucoma and other disorders of the eye. Is the patient up to date with their annual eye exam?  No  Who is the provider or what is the name of the office in which the patient attends annual eye exams? Not established - new to area If pt is not established with a provider, would they like to be referred to a provider to establish care? Yes .   Dental Screening: Recommended annual dental exams for proper oral hygiene  Community Resource Referral / Chronic Care Management: CRR required this visit?  No   CCM required this visit?  No      Plan:     I have personally reviewed and noted the following in the patient's chart:   . Medical and social history . Use of alcohol, tobacco or illicit drugs  . Current medications and supplements . Functional ability and status . Nutritional status . Physical activity . Advanced directives . List of other physicians . Hospitalizations, surgeries, and ER visits in previous 12 months . Vitals . Screenings to include cognitive, depression, and falls . Referrals and appointments  In addition, I have reviewed and discussed with patient certain preventive protocols, quality metrics, and best practice recommendations. A written personalized care plan for preventive services as well as general preventive health recommendations were provided to patient.     Reather Littler, LPN   16/10/9602   Nurse Notes: pt states she is concerned about increase level of atorvastatin and potential affect on her liver. Pt would like to have labs done again prior to physical in one year. She inquired about an EKG for baseline due to feeling more "winded" with activity,  not necessarily  shortness of breath. Pt has no hx of HTN or heart disease. Pt advised would notify Dr. Judithann GravesBerglund.

## 2020-02-08 ENCOUNTER — Ambulatory Visit
Admission: RE | Admit: 2020-02-08 | Discharge: 2020-02-08 | Disposition: A | Discharge status: Home / Self Care | Payer: Medicare Other | Source: Ambulatory Visit | Attending: Internal Medicine | Admitting: Internal Medicine

## 2020-02-08 ENCOUNTER — Encounter: Payer: Self-pay | Admitting: Internal Medicine

## 2020-02-08 ENCOUNTER — Other Ambulatory Visit: Payer: Self-pay

## 2020-02-08 DIAGNOSIS — Z1231 Encounter for screening mammogram for malignant neoplasm of breast: Secondary | ICD-10-CM | POA: Insufficient documentation

## 2020-02-08 DIAGNOSIS — E2839 Other primary ovarian failure: Secondary | ICD-10-CM | POA: Diagnosis not present

## 2020-02-08 DIAGNOSIS — M8580 Other specified disorders of bone density and structure, unspecified site: Secondary | ICD-10-CM | POA: Insufficient documentation

## 2020-02-09 ENCOUNTER — Other Ambulatory Visit: Payer: Self-pay | Admitting: Internal Medicine

## 2020-02-09 ENCOUNTER — Encounter: Payer: Self-pay | Admitting: Internal Medicine

## 2020-02-09 DIAGNOSIS — N6310 Unspecified lump in the right breast, unspecified quadrant: Secondary | ICD-10-CM

## 2020-02-09 DIAGNOSIS — N6489 Other specified disorders of breast: Secondary | ICD-10-CM

## 2020-02-09 DIAGNOSIS — R928 Other abnormal and inconclusive findings on diagnostic imaging of breast: Secondary | ICD-10-CM

## 2020-02-17 ENCOUNTER — Ambulatory Visit
Admission: RE | Admit: 2020-02-17 | Discharge: 2020-02-17 | Disposition: A | Discharge status: Home / Self Care | Payer: Medicare Other | Source: Ambulatory Visit | Attending: Internal Medicine | Admitting: Internal Medicine

## 2020-02-17 ENCOUNTER — Other Ambulatory Visit: Payer: Self-pay

## 2020-02-17 DIAGNOSIS — N6489 Other specified disorders of breast: Secondary | ICD-10-CM

## 2020-02-17 DIAGNOSIS — R928 Other abnormal and inconclusive findings on diagnostic imaging of breast: Secondary | ICD-10-CM

## 2020-02-17 DIAGNOSIS — N6310 Unspecified lump in the right breast, unspecified quadrant: Secondary | ICD-10-CM

## 2020-03-13 ENCOUNTER — Encounter: Payer: Self-pay | Admitting: Internal Medicine

## 2020-03-13 ENCOUNTER — Other Ambulatory Visit: Payer: Self-pay

## 2020-03-13 ENCOUNTER — Ambulatory Visit (INDEPENDENT_AMBULATORY_CARE_PROVIDER_SITE_OTHER): Payer: Medicare Other | Admitting: Internal Medicine

## 2020-03-13 VITALS — BP 128/70 | HR 82 | Ht <= 58 in | Wt 122.0 lb

## 2020-03-13 DIAGNOSIS — M25512 Pain in left shoulder: Secondary | ICD-10-CM

## 2020-03-13 DIAGNOSIS — R7309 Other abnormal glucose: Secondary | ICD-10-CM | POA: Diagnosis not present

## 2020-03-13 DIAGNOSIS — R102 Pelvic and perineal pain: Secondary | ICD-10-CM

## 2020-03-13 DIAGNOSIS — E782 Mixed hyperlipidemia: Secondary | ICD-10-CM

## 2020-03-13 NOTE — Progress Notes (Signed)
Date:  03/13/2020   Name:  Erin Yu   DOB:  [birth year]   MRN:  [id number]   Chief Complaint: Hyperlipidemia (4 month Lipid follow up.), Shoulder Pain (Left shoulder pain is worse than right but it is both. X 3 weeks. Shoveled some snow and has not been right since.), and Discuss Medications (Wants to know if she is taking too many vitamins.)  Hyperlipidemia This is a chronic problem. The problem is uncontrolled. Associated symptoms include myalgias (left upper arm). Pertinent negatives include no chest pain or shortness of breath. Current antihyperlipidemic treatment includes statins (dose increased last visit). There are no compliance problems.   Shoulder Pain  The pain is present in the left shoulder. This is a recurrent problem. The current episode started 1 to 4 weeks ago. There has been no history of extremity trauma (but started after shoveling snow). The quality of the pain is described as aching. The pain is mild. Associated symptoms include a limited range of motion. Pertinent negatives include no fever, numbness or tingling. The symptoms are aggravated by activity.  Abdominal Pain This is a recurrent problem. The current episode started more than 1 month ago. The problem occurs intermittently (several days per week). The pain is located in the RLQ. The pain is mild. The quality of the pain is cramping. The abdominal pain does not radiate. Associated symptoms include arthralgias (left shoulder) and myalgias (left upper arm). Pertinent negatives include no constipation, diarrhea, fever or headaches.  Elevated fasting glucose - noted on labs in September.  Patient was advised to limit carbs and return for a recheck. Elevated BP at home - normal BP last visit.   Lab Results  Component Value Date   CREATININE 0.62 10/18/2019   BUN 22 10/18/2019   NA 139 10/18/2019   K 4.6 10/18/2019   CL 104 10/18/2019   CO2 27 10/18/2019   Lab Results  Component Value Date   CHOL 236 (H)  10/18/2019   HDL 87 10/18/2019   LDLCALC 140 (H) 10/18/2019   TRIG 43 10/18/2019   CHOLHDL 2.7 10/18/2019   No results found for: TSH No results found for: HGBA1C Lab Results  Component Value Date   WBC 4.3 10/18/2019   HGB 15.0 10/18/2019   HCT 44.6 10/18/2019   MCV 92.3 10/18/2019   PLT 305 10/18/2019   Lab Results  Component Value Date   ALT 21 10/18/2019   AST 17 10/18/2019   ALKPHOS 45 10/18/2019   BILITOT 0.9 10/18/2019     Review of Systems  Constitutional: Negative for fatigue, fever and unexpected weight change.  HENT: Negative for nosebleeds.   Eyes: Negative for visual disturbance.  Respiratory: Negative for cough, chest tightness, shortness of breath and wheezing.   Cardiovascular: Negative for chest pain, palpitations and leg swelling.  Gastrointestinal: Negative for abdominal distention, abdominal pain, constipation and diarrhea.  Genitourinary: Positive for pelvic pain (RLQ discomfort intermittently).  Musculoskeletal: Positive for arthralgias (left shoulder) and myalgias (left upper arm).  Neurological: Negative for dizziness, tingling, weakness, light-headedness, numbness and headaches.  Psychiatric/Behavioral: Negative for dysphoric mood and sleep disturbance. The patient is not nervous/anxious.     Patient Active Problem List   Diagnosis Date Noted  . Osteopenia determined by x-ray 02/08/2020  . Elevated glucose 10/19/2019  . Primary insomnia 10/18/2019  . HNP (herniated nucleus pulposus), lumbar 08/09/2019  . Primary osteoarthritis of both first carpometacarpal joints 08/09/2019  . OAB (overactive bladder) 08/09/2019  . Mixed hyperlipidemia 08/09/2019  .  Elevated blood pressure reading 08/09/2019  . 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 Pseudophakia of both eyes 08/22/2017    No Known Allergies  Past Surgical History:  Procedure Laterality Date  . ABDOMINAL HYSTERECTOMY    . BREAST BIOPSY Bilateral    neg  . BUNIONECTOMY    . CATARACT  EXTRACTION    . CESAREAN SECTION    . FACIAL COSMETIC SURGERY    . PARS PLANA VITRECTOMY W/ REPAIR OF MACULAR HOLE      Social History   Tobacco Use  . Smoking status: Never Smoker  . Smokeless tobacco: Never Used  Vaping Use  . Vaping Use: Never used  Substance Use Topics  . Alcohol use: Yes    Comment: social  . Drug use: Never     Medication list has been reviewed and updated.  Current Meds  Medication Sig  . Ascorbic Acid (VITAMIN C) 1000 MG tablet Take 1,000 mg by mouth daily.  Marland Kitchen atorvastatin (LIPITOR) 20 MG tablet Take 1 tablet (20 mg total) by mouth daily.  . Multiple Vitamins-Minerals (PRESERVISION AREDS 2+MULTI VIT PO) Take by mouth.  . Multiple Vitamins-Minerals (WOMENS MULTIVITAMIN PO) Take by mouth.  . Nutritional Supplements (VITAMIN D BOOSTER PO) Take by mouth.  . traZODone (DESYREL) 150 MG tablet Take 2 tablets (300 mg total) by mouth at bedtime.    PHQ 2/9 Scores 03/13/2020 11/07/2019 08/23/2019 08/09/2019  PHQ - 2 Score 0 0 0 0  PHQ- 9 Score 0 - 1 0    GAD 7 : Generalized Anxiety Score 03/13/2020 08/23/2019 08/09/2019  Nervous, Anxious, on Edge 0 0 0  Control/stop worrying 0 0 0  Worry too much - different things 0 0 0  Trouble relaxing 0 0 0  Restless 0 0 0  Easily annoyed or irritable 0 0 0  Afraid - awful might happen 0 0 0  Total GAD 7 Score 0 0 0  Anxiety Difficulty Not difficult at all Not difficult at all Not difficult at all    BP Readings from Last 3 Encounters:  03/13/20 128/70  11/07/19 118/72  10/18/19 108/64    Physical Exam Vitals and nursing note reviewed.  Constitutional:      General: She is not in acute distress.    Appearance: She is well-developed.  HENT:     Head: Normocephalic and atraumatic.  Neck:     Vascular: No carotid bruit.  Cardiovascular:     Rate and Rhythm: Normal rate and regular rhythm.     Pulses: Normal pulses.  Pulmonary:     Effort: Pulmonary effort is normal. No respiratory distress.     Breath  sounds: No wheezing or rhonchi.  Abdominal:     General: Abdomen is flat.     Palpations: Abdomen is soft.     Tenderness: There is abdominal tenderness in the right lower quadrant. There is no right CVA tenderness, left CVA tenderness, guarding or rebound.  Musculoskeletal:     Right shoulder: No tenderness or bony tenderness. Decreased range of motion.     Left shoulder: Crepitus present. Decreased range of motion.     Cervical back: Normal range of motion.     Right lower leg: No edema.     Left lower leg: No edema.  Lymphadenopathy:     Cervical: No cervical adenopathy.  Skin:    General: Skin is warm and dry.     Findings: No rash.  Neurological:     Mental Status: She  is alert and oriented to person, place, and time.  Psychiatric:        Mood and Affect: Mood normal.        Behavior: Behavior normal.     Wt Readings from Last 3 Encounters:  03/13/20 122 lb (55.3 kg)  11/07/19 123 lb 6.4 oz (56 kg)  10/18/19 122 lb (55.3 kg)    BP 128/70   Pulse 82   Ht 4\' 10"  (1.473 m)   Wt 122 lb (55.3 kg)   SpO2 98%   BMI 25.50 kg/m   Assessment and Plan: 1. Mixed hyperlipidemia Tolerating the higher dose statin Will continue 20 mg daily; check labs - Lipid panel - Comprehensive metabolic panel  2. Acute pain of left shoulder Likely soft tissue inflammation from over-exertion Recommend Mobic daily, ice/heat after exertion and topical rubs If no improvement, will refer   3. Pelvic pain Hx of partial hysterectomy with ovaries remaining Exam benign - will get - US Pelvic Complete With Transvaginal; Future  4. Elevated glucose Check A1C today - Hemoglobin A1c   Partially dictated using [place]. Any errors are unintentional.  Animal nutritionist, MD [facility]  03/13/2020

## 2020-03-14 LAB — COMPREHENSIVE METABOLIC PANEL
ALT: 19 IU/L (ref 0–32)
AST: 14 IU/L (ref 0–40)
Albumin/Globulin Ratio: 2.5 — ABNORMAL HIGH (ref 1.2–2.2)
Albumin: 4.9 g/dL — ABNORMAL HIGH (ref 3.8–4.8)
Alkaline Phosphatase: 61 IU/L (ref 44–121)
BUN/Creatinine Ratio: 29 — ABNORMAL HIGH (ref 12–28)
BUN: 19 mg/dL (ref 8–27)
Bilirubin Total: 0.5 mg/dL (ref 0.0–1.2)
CO2: 21 mmol/L (ref 20–29)
Calcium: 9.4 mg/dL (ref 8.7–10.3)
Chloride: 106 mmol/L (ref 96–106)
Creatinine, Ser: 0.65 mg/dL (ref 0.57–1.00)
GFR calc Af Amer: 106 mL/min/{1.73_m2} (ref >59)
GFR calc non Af Amer: 92 mL/min/{1.73_m2} (ref >59)
Globulin, Total: 2 g/dL (ref 1.5–4.5)
Glucose: 105 mg/dL — ABNORMAL HIGH (ref 65–99)
Potassium: 4.8 mmol/L (ref 3.5–5.2)
Sodium: 144 mmol/L (ref 134–144)
Total Protein: 6.9 g/dL (ref 6.0–8.5)

## 2020-03-14 LAB — LIPID PANEL
Chol/HDL Ratio: 2.4 ratio (ref 0.0–4.4)
Cholesterol, Total: 214 mg/dL — ABNORMAL HIGH (ref 100–199)
HDL: 88 mg/dL (ref >39)
LDL Chol Calc (NIH): 119 mg/dL — ABNORMAL HIGH (ref 0–99)
Triglycerides: 40 mg/dL (ref 0–149)
VLDL Cholesterol Cal: 7 mg/dL (ref 5–40)

## 2020-03-14 LAB — HEMOGLOBIN A1C
Est. average glucose Bld gHb Est-mCnc: 105 mg/dL
Hgb A1c MFr Bld: 5.3 % (ref 4.8–5.6)

## 2020-03-15 ENCOUNTER — Ambulatory Visit
Admission: RE | Admit: 2020-03-15 | Discharge: 2020-03-15 | Disposition: A | Discharge status: Home / Self Care | Payer: Medicare Other | Source: Ambulatory Visit | Attending: Internal Medicine | Admitting: Internal Medicine

## 2020-03-15 ENCOUNTER — Other Ambulatory Visit: Payer: Self-pay

## 2020-03-15 DIAGNOSIS — R102 Pelvic and perineal pain: Secondary | ICD-10-CM | POA: Diagnosis not present

## 2020-05-15 ENCOUNTER — Encounter: Payer: Self-pay | Admitting: Internal Medicine

## 2020-05-15 ENCOUNTER — Ambulatory Visit (INDEPENDENT_AMBULATORY_CARE_PROVIDER_SITE_OTHER): Payer: Medicare Other | Admitting: Internal Medicine

## 2020-05-15 ENCOUNTER — Other Ambulatory Visit: Payer: Self-pay

## 2020-05-15 VITALS — BP 112/68 | HR 94 | Temp 98.4°F | Ht <= 58 in | Wt 123.0 lb

## 2020-05-15 DIAGNOSIS — Z01818 Encounter for other preprocedural examination: Secondary | ICD-10-CM

## 2020-05-15 DIAGNOSIS — I4510 Unspecified right bundle-branch block: Secondary | ICD-10-CM | POA: Diagnosis not present

## 2020-05-15 NOTE — Telephone Encounter (Signed)
Information for medical record request.   KP

## 2020-05-15 NOTE — Progress Notes (Signed)
Date:  05/15/2020   Name:  Erin Yu   DOB:  September 21, 1950   MRN:  [id number]   Chief Complaint: Pre-op Exam (EKG needed for surgery clearance form. Patient having Blepharoplasty with [facility] in [place]. PH: [phone], FAX: [phone])  HPI  Lab Results  Component Value Date   CREATININE 0.65 03/13/2020   BUN 19 03/13/2020   NA 144 03/13/2020   K 4.8 03/13/2020   CL 106 03/13/2020   CO2 21 03/13/2020   Lab Results  Component Value Date   CHOL 214 (H) 03/13/2020   HDL 88 03/13/2020   LDLCALC 119 (H) 03/13/2020   TRIG 40 03/13/2020   CHOLHDL 2.4 03/13/2020   No results found for: TSH Lab Results  Component Value Date   HGBA1C 5.3 03/13/2020   Lab Results  Component Value Date   WBC 4.3 10/18/2019   HGB 15.0 10/18/2019   HCT 44.6 10/18/2019   MCV 92.3 10/18/2019   PLT 305 10/18/2019   Lab Results  Component Value Date   ALT 19 03/13/2020   AST 14 03/13/2020   ALKPHOS 61 03/13/2020   BILITOT 0.5 03/13/2020     Review of Systems  Constitutional: Negative for fatigue and unexpected weight change.  HENT: Negative for nosebleeds.   Eyes: Positive for visual disturbance.  Respiratory: Negative for cough, chest tightness, shortness of breath and wheezing.   Cardiovascular: Negative for chest pain, palpitations and leg swelling.  Gastrointestinal: Negative for abdominal pain, constipation and diarrhea.  Neurological: Negative for dizziness, weakness, light-headedness and headaches.    Patient Active Problem List   Diagnosis Date Noted  . Osteopenia determined by x-ray 02/08/2020  . Elevated glucose 10/19/2019  . Primary insomnia 10/18/2019  . HNP (herniated nucleus pulposus), lumbar 08/09/2019  . Primary osteoarthritis of both first carpometacarpal joints 08/09/2019  . OAB (overactive bladder) 08/09/2019  . Mixed hyperlipidemia 08/09/2019  . Elevated blood pressure reading 08/09/2019  . Macular hole of right eye  08/22/2017  . Pseudophakia of both eyes 08/22/2017    No Known Allergies  Past Surgical History:  Procedure Laterality Date  . ABDOMINAL HYSTERECTOMY    . BREAST BIOPSY Bilateral    neg  . BUNIONECTOMY    . CATARACT EXTRACTION    . CESAREAN SECTION    . FACIAL COSMETIC SURGERY    . PARS PLANA VITRECTOMY W/ REPAIR OF MACULAR HOLE      Social History   Tobacco Use  . Smoking status: Never Smoker  . Smokeless tobacco: Never Used  Vaping Use  . Vaping Use: Never used  Substance Use Topics  . Alcohol use: Yes    Comment: social  . Drug use: Never     Medication list has been reviewed and updated.  Current Meds  Medication Sig  . Ascorbic Acid (VITAMIN C) 1000 MG tablet Take 1,000 mg by mouth daily.  Marland Kitchen atorvastatin (LIPITOR) 20 MG tablet Take 1 tablet (20 mg total) by mouth daily.  . Calcium Carbonate (CALCIUM 600 PO) Take 1 tablet by mouth daily at 6 (six) AM.  . Cholecalciferol (VITAMIN D) 50 MCG (2000 UT) CAPS Take 1 capsule by mouth daily at 2 PM.  . Multiple Vitamins-Minerals (PRESERVISION AREDS 2+MULTI VIT PO) Take by mouth.  . Multiple Vitamins-Minerals (WOMENS MULTIVITAMIN PO) Take by mouth.  . traZODone (DESYREL) 150 MG tablet Take 2 tablets (300 mg total) by mouth at bedtime.    PHQ 2/9 Scores 05/15/2020 03/13/2020 11/07/2019 08/23/2019  PHQ - 2 Score 0 0 0 0  PHQ- 9 Score 0 0 - 1    GAD 7 : Generalized Anxiety Score 05/15/2020 03/13/2020 08/23/2019 08/09/2019  Nervous, Anxious, on Edge 0 0 0 0  Control/stop worrying 0 0 0 0  Worry too much - different things 0 0 0 0  Trouble relaxing 0 0 0 0  Restless 0 0 0 0  Easily annoyed or irritable 0 0 0 0  Afraid - awful might happen 0 0 0 0  Total GAD 7 Score 0 0 0 0  Anxiety Difficulty Not difficult at all Not difficult at all Not difficult at all Not difficult at all    BP Readings from Last 3 Encounters:  05/15/20 112/68  03/13/20 128/70  11/07/19 118/72    Physical Exam Vitals and nursing note reviewed.   Constitutional:      General: She is not in acute distress.    Appearance: She is well-developed.  HENT:     Head: Normocephalic and atraumatic.  Neck:     Vascular: No carotid bruit.  Cardiovascular:     Rate and Rhythm: Normal rate and regular rhythm.     Pulses: Normal pulses.     Heart sounds: No murmur heard.   Pulmonary:     Effort: Pulmonary effort is normal. No respiratory distress.     Breath sounds: No wheezing or rhonchi.  Musculoskeletal:     Cervical back: Normal range of motion.     Right lower leg: No edema.     Left lower leg: No edema.  Lymphadenopathy:     Cervical: No cervical adenopathy.  Skin:    General: Skin is warm and dry.     Findings: No rash.  Neurological:     Mental Status: She is alert and oriented to person, place, and time.  Psychiatric:        Mood and Affect: Mood normal.        Behavior: Behavior normal.     Wt Readings from Last 3 Encounters:  05/15/20 123 lb (55.8 kg)  03/13/20 122 lb (55.3 kg)  11/07/19 123 lb 6.4 oz (56 kg)    BP 112/68   Pulse 94   Temp 98.4 F (36.9 C) (Oral)   Ht 4\' 10"  (1.473 m)   Wt 123 lb (55.8 kg)   SpO2 98%   BMI 25.71 kg/m   Assessment and Plan: 1. Pre-op examination Normal exam; no contraindication to Blepharoplasty Will send labs and EKG - EKG 12-Lead - RBBB  2. Right bundle branch block (RBBB) Possibly new - will try to get previous EKG from [facility] Consider referral to Cardiology   Partially dictated using Dragon software. Any errors are unintentional.  [facility], MD [facility]  05/15/2020

## 2020-05-22 ENCOUNTER — Encounter: Payer: Self-pay | Admitting: Internal Medicine

## 2020-05-24 ENCOUNTER — Encounter: Payer: Self-pay | Admitting: Internal Medicine

## 2020-06-06 ENCOUNTER — Encounter: Payer: Self-pay | Admitting: Internal Medicine

## 2020-10-19 ENCOUNTER — Ambulatory Visit (INDEPENDENT_AMBULATORY_CARE_PROVIDER_SITE_OTHER): Payer: Medicare Other | Admitting: Internal Medicine

## 2020-10-19 ENCOUNTER — Other Ambulatory Visit: Payer: Self-pay

## 2020-10-19 ENCOUNTER — Encounter: Payer: Self-pay | Admitting: Internal Medicine

## 2020-10-19 VITALS — BP 128/70 | HR 76 | Ht <= 58 in | Wt 123.4 lb

## 2020-10-19 DIAGNOSIS — Z0000 Encounter for general adult medical examination without abnormal findings: Secondary | ICD-10-CM | POA: Diagnosis not present

## 2020-10-19 DIAGNOSIS — E559 Vitamin D deficiency, unspecified: Secondary | ICD-10-CM

## 2020-10-19 DIAGNOSIS — F419 Anxiety disorder, unspecified: Secondary | ICD-10-CM

## 2020-10-19 DIAGNOSIS — R7309 Other abnormal glucose: Secondary | ICD-10-CM

## 2020-10-19 DIAGNOSIS — Z1231 Encounter for screening mammogram for malignant neoplasm of breast: Secondary | ICD-10-CM

## 2020-10-19 DIAGNOSIS — Z23 Encounter for immunization: Secondary | ICD-10-CM

## 2020-10-19 DIAGNOSIS — R0600 Dyspnea, unspecified: Secondary | ICD-10-CM

## 2020-10-19 DIAGNOSIS — R0609 Other forms of dyspnea: Secondary | ICD-10-CM

## 2020-10-19 DIAGNOSIS — E782 Mixed hyperlipidemia: Secondary | ICD-10-CM

## 2020-10-19 DIAGNOSIS — M8580 Other specified disorders of bone density and structure, unspecified site: Secondary | ICD-10-CM

## 2020-10-19 DIAGNOSIS — F5101 Primary insomnia: Secondary | ICD-10-CM

## 2020-10-19 DIAGNOSIS — Z1159 Encounter for screening for other viral diseases: Secondary | ICD-10-CM

## 2020-10-19 MED ORDER — TRAZODONE HCL 150 MG PO TABS: 300.0000 mg | ORAL_TABLET | Freq: Every day | ORAL | 1 refills | Status: DC

## 2020-10-19 MED ORDER — DIAZEPAM 5 MG PO TABS: 5.0000 mg | ORAL_TABLET | Freq: Two times a day (BID) | ORAL | 0 refills | Status: DC | PRN

## 2020-10-19 MED ORDER — ATORVASTATIN CALCIUM 20 MG PO TABS: 20.0000 mg | ORAL_TABLET | Freq: Every day | ORAL | 1 refills | Status: DC

## 2020-10-19 NOTE — Progress Notes (Signed)
Date:  10/19/2020   Name:  Erin Yu   DOB:  1950/12/14   MRN:  [id number]   Chief Complaint: Annual Exam (Breast exam no pap ) and Flu Vaccine Suesan Mohrmann is a 70 y.o. female who presents today for her Complete Annual Exam. She feels well. She reports exercising - walking the dog every day. She reports she is sleeping poorly. Breast complaints - none.  Still tearful after losing her dog in July.  Mammogram: 01/2020 DEXA: 01/2020 osteopenia Pap smear: discontinued Colonoscopy: 03/2017   Immunization History  Administered Date(s) Administered   Fluad Quad(high Dose 65+) 10/18/2019, 10/19/2020   Influenza, High Dose Seasonal PF 10/30/2017   Influenza-Unspecified 10/08/2018   PFIZER(Purple Top)SARS-COV-2 Vaccination 03/09/2019, 03/30/2019, 11/19/2019   Pneumococcal Conjugate-13 08/23/2019   Pneumococcal Polysaccharide-23 10/30/2017   Tdap 08/17/2015   Zoster Recombinat (Shingrix) 08/30/2017, 12/27/2017    Hyperlipidemia This is a chronic problem. The problem is controlled. Associated symptoms include shortness of breath. Pertinent negatives include no chest pain. Current antihyperlipidemic treatment includes statins. The current treatment provides significant improvement of lipids.  Insomnia Primary symptoms: no sleep disturbance.   The problem occurs nightly. The problem is unchanged.  Shortness of Breath This is a new problem. The current episode started more than 1 month ago (with walking up stairs 1 flight). The problem occurs daily. The problem has been unchanged. The average episode lasts 2 minutes. Pertinent negatives include no abdominal pain, chest pain, fever, headaches, leg swelling, orthopnea, rash, sputum production, vomiting or wheezing. The symptoms are aggravated by exercise. The patient has no known risk factors for DVT/PE. She has tried rest (resolves after a couple of minutes rest) for the symptoms. The treatment provided significant relief.  Osteopenia on DEXA -  on calcium and vitamin D daily.  No fractures. Exercising daily walking the dog.  Lab Results  Component Value Date   CREATININE 0.65 03/13/2020   BUN 19 03/13/2020   NA 144 03/13/2020   K 4.8 03/13/2020   CL 106 03/13/2020   CO2 21 03/13/2020   Lab Results  Component Value Date   CHOL 214 (H) 03/13/2020   HDL 88 03/13/2020   LDLCALC 119 (H) 03/13/2020   TRIG 40 03/13/2020   CHOLHDL 2.4 03/13/2020   No results found for: TSH Lab Results  Component Value Date   HGBA1C 5.3 03/13/2020   Lab Results  Component Value Date   WBC 4.3 10/18/2019   HGB 15.0 10/18/2019   HCT 44.6 10/18/2019   MCV 92.3 10/18/2019   PLT 305 10/18/2019   Lab Results  Component Value Date   ALT 19 03/13/2020   AST 14 03/13/2020   ALKPHOS 61 03/13/2020   BILITOT 0.5 03/13/2020  Last vitamin D No results found for: 25OHVITD2, 25OHVITD3, VD25OH    Review of Systems  Constitutional:  Negative for chills, fatigue and fever.  HENT:  Negative for congestion, hearing loss, tinnitus, trouble swallowing and voice change.   Eyes:  Negative for visual disturbance.  Respiratory:  Positive for shortness of breath. Negative for cough, sputum production, chest tightness and wheezing.   Cardiovascular:  Negative for chest pain, palpitations, orthopnea and leg swelling.  Gastrointestinal:  Negative for abdominal pain, constipation, diarrhea and vomiting.  Endocrine: Negative for polydipsia and polyuria.  Genitourinary:  Negative for dysuria, frequency, genital sores, vaginal bleeding and vaginal discharge.  Musculoskeletal:  Negative for arthralgias, gait problem and joint swelling.  Skin:  Negative for color change and rash.  Neurological:  Negative for dizziness, tremors, light-headedness and headaches.  Hematological:  Negative for adenopathy. Does not bruise/bleed easily.  Psychiatric/Behavioral:  Negative for dysphoric mood and sleep disturbance. The patient has insomnia. The patient is not  nervous/anxious.    Patient Active Problem List   Diagnosis Date Noted   Right bundle branch block (RBBB) 05/15/2020   Osteopenia determined by x-ray 02/08/2020   Elevated glucose 10/19/2019   Primary insomnia 10/18/2019   HNP (herniated nucleus pulposus), lumbar 08/09/2019   Primary osteoarthritis of both first carpometacarpal joints 08/09/2019   OAB (overactive bladder) 08/09/2019   Mixed hyperlipidemia 08/09/2019   Macular hole of right eye 08/22/2017   Pseudophakia of both eyes 08/22/2017    No Known Allergies  Past Surgical History:  Procedure Laterality Date   ABDOMINAL HYSTERECTOMY     BREAST BIOPSY Bilateral    neg   BUNIONECTOMY     CATARACT EXTRACTION     CESAREAN SECTION     FACIAL COSMETIC SURGERY     PARS PLANA VITRECTOMY W/ REPAIR OF MACULAR HOLE      Social History   Tobacco Use   Smoking status: Never   Smokeless tobacco: Never  Vaping Use   Vaping Use: Never used  Substance Use Topics   Alcohol use: Yes    Comment: social   Drug use: Never     Medication list has been reviewed and updated.  Current Meds  Medication Sig   Ascorbic Acid (VITAMIN C) 1000 MG tablet Take 1,000 mg by mouth daily.   atorvastatin (LIPITOR) 20 MG tablet Take 1 tablet (20 mg total) by mouth daily.   Calcium Carbonate (CALCIUM 600 PO) Take 1 tablet by mouth daily at 6 (six) AM.   Cholecalciferol (VITAMIN D) 50 MCG (2000 UT) CAPS Take 1 capsule by mouth daily at 2 PM.   diazepam (VALIUM) 5 MG tablet Take 1 tablet (5 mg total) by mouth every 12 (twelve) hours as needed for anxiety.   Multiple Vitamins-Minerals (PRESERVISION AREDS 2+MULTI VIT PO) Take by mouth.   Multiple Vitamins-Minerals (WOMENS MULTIVITAMIN PO) Take by mouth.   traZODone (DESYREL) 150 MG tablet Take 2 tablets (300 mg total) by mouth at bedtime.    PHQ 2/9 Scores 10/19/2020 05/15/2020 03/13/2020 11/07/2019  PHQ - 2 Score 2 0 0 0  PHQ- 9 Score 5 0 0 -    GAD 7 : Generalized Anxiety Score 10/19/2020  05/15/2020 03/13/2020 08/23/2019  Nervous, Anxious, on Edge 1 0 0 0  Control/stop worrying 3 0 0 0  Worry too much - different things 3 0 0 0  Trouble relaxing 0 0 0 0  Restless 0 0 0 0  Easily annoyed or irritable 2 0 0 0  Afraid - awful might happen 0 0 0 0  Total GAD 7 Score 9 0 0 0  Anxiety Difficulty Not difficult at all Not difficult at all Not difficult at all Not difficult at all    BP Readings from Last 3 Encounters:  10/19/20 128/70  05/15/20 112/68  03/13/20 128/70    Physical Exam Vitals and nursing note reviewed.  Constitutional:      General: She is not in acute distress.    Appearance: She is well-developed.  HENT:     Head: Normocephalic and atraumatic.     Right Ear: Tympanic membrane and ear canal normal.     Left Ear: Tympanic membrane and ear canal normal.     Nose:     Right Sinus: No maxillary  sinus tenderness.     Left Sinus: No maxillary sinus tenderness.  Eyes:     General: No scleral icterus.       Right eye: No discharge.        Left eye: No discharge.     Conjunctiva/sclera: Conjunctivae normal.  Neck:     Thyroid: No thyromegaly.     Vascular: No carotid bruit.  Cardiovascular:     Rate and Rhythm: Normal rate and regular rhythm.     Pulses: Normal pulses.     Heart sounds: Normal heart sounds.  Pulmonary:     Effort: Pulmonary effort is normal. No respiratory distress.     Breath sounds: No wheezing.  Chest:  Breasts:    Right: No mass, nipple discharge, skin change or tenderness.     Left: No mass, nipple discharge, skin change or tenderness.     Comments: Moderate fibrocystic changes bilaterally Abdominal:     General: Bowel sounds are normal.     Palpations: Abdomen is soft.     Tenderness: There is no abdominal tenderness.  Musculoskeletal:     Cervical back: Normal range of motion. No erythema.     Right lower leg: No edema.     Left lower leg: No edema.  Lymphadenopathy:     Cervical: No cervical adenopathy.  Skin:     General: Skin is warm and dry.     Capillary Refill: Capillary refill takes less than 2 seconds.     Findings: No rash.  Neurological:     General: No focal deficit present.     Mental Status: She is alert and oriented to person, place, and time.     Cranial Nerves: No cranial nerve deficit.     Sensory: No sensory deficit.     Deep Tendon Reflexes: Reflexes are normal and symmetric.  Psychiatric:        Attention and Perception: Attention normal.        Mood and Affect: Mood normal.    Wt Readings from Last 3 Encounters:  10/19/20 123 lb 6.4 oz (56 kg)  05/15/20 123 lb (55.8 kg)  03/13/20 122 lb (55.3 kg)    BP 128/70   Pulse 76   Ht 4\' 10"  (1.473 m)   Wt 123 lb 6.4 oz (56 kg)   BMI 25.79 kg/m   Assessment and Plan: 1. Mixed hyperlipidemia Tolerating statin medication without side effects at this time LDL is at goal of < 70 on current dose Continue same therapy without change at this time. DOE concerning for underlying CAD - Comprehensive metabolic panel - Lipid panel - TSH  2. Annual physical exam Normal exam Up to date on screenings and immunizations  3. Encounter for screening mammogram for breast cancer - MM 3D SCREEN BREAST BILATERAL  4. Elevated glucose Check labs  Diet changes discussed - Comprehensive metabolic panel - Hemoglobin A1c  5. Osteopenia determined by x-ray Continue exercise, calcium and vitamin D  6. Primary insomnia controlled  7. Need for hepatitis C screening test - Hepatitis C antibody  8. DOE (dyspnea on exertion) Recommend Cardiology evaluation - CBC with Differential/Platelet - TSH - Ambulatory referral to Cardiology  9. Vitamin D deficiency - VITAMIN D 25 Hydroxy (Vit-D Deficiency, Fractures)  10. Anxiety Over highway travel - will give a few valium for upcoming trip - diazepam (VALIUM) 5 MG tablet; Take 1 tablet (5 mg total) by mouth every 12 (twelve) hours as needed for anxiety.  Dispense: 5 tablet; Refill:  0  11.  Need for immunization against influenza - Flu Vaccine QUAD High Dose(Fluad)   Partially dictated using Animal nutritionist. Any errors are unintentional.  Bari Edward, MD [facility]  10/19/2020

## 2020-10-20 LAB — VITAMIN D 25 HYDROXY (VIT D DEFICIENCY, FRACTURES): Vit D, 25-Hydroxy: 74.6 ng/mL (ref 30.0–100.0)

## 2020-10-20 LAB — CBC WITH DIFFERENTIAL/PLATELET
Basophils Absolute: 0.1 10*3/uL (ref 0.0–0.2)
Basos: 1 %
EOS (ABSOLUTE): 0.3 10*3/uL (ref 0.0–0.4)
Eos: 7 %
Hematocrit: 43.7 % (ref 34.0–46.6)
Hemoglobin: 14.7 g/dL (ref 11.1–15.9)
Immature Grans (Abs): 0 10*3/uL (ref 0.0–0.1)
Immature Granulocytes: 0 %
Lymphocytes Absolute: 1.7 10*3/uL (ref 0.7–3.1)
Lymphs: 35 %
MCH: 30.8 pg (ref 26.6–33.0)
MCHC: 33.6 g/dL (ref 31.5–35.7)
MCV: 91 fL (ref 79–97)
Monocytes Absolute: 0.4 10*3/uL (ref 0.1–0.9)
Monocytes: 9 %
Neutrophils Absolute: 2.4 10*3/uL (ref 1.4–7.0)
Neutrophils: 48 %
Platelets: 247 10*3/uL (ref 150–450)
RBC: 4.78 x10E6/uL (ref 3.77–5.28)
RDW: 12.2 % (ref 11.7–15.4)
WBC: 5 10*3/uL (ref 3.4–10.8)

## 2020-10-20 LAB — HEPATITIS C ANTIBODY: Hep C Virus Ab: <0.1 s/co ratio (ref 0.0–0.9)

## 2020-10-20 LAB — COMPREHENSIVE METABOLIC PANEL
ALT: 31 IU/L (ref 0–32)
AST: 19 IU/L (ref 0–40)
Albumin/Globulin Ratio: 2.3 — ABNORMAL HIGH (ref 1.2–2.2)
Albumin: 4.9 g/dL — ABNORMAL HIGH (ref 3.8–4.8)
Alkaline Phosphatase: 53 IU/L (ref 44–121)
BUN/Creatinine Ratio: 26 (ref 12–28)
BUN: 17 mg/dL (ref 8–27)
Bilirubin Total: 0.6 mg/dL (ref 0.0–1.2)
CO2: 24 mmol/L (ref 20–29)
Calcium: 9.9 mg/dL (ref 8.7–10.3)
Chloride: 105 mmol/L (ref 96–106)
Creatinine, Ser: 0.66 mg/dL (ref 0.57–1.00)
Globulin, Total: 2.1 g/dL (ref 1.5–4.5)
Glucose: 97 mg/dL (ref 65–99)
Potassium: 4.8 mmol/L (ref 3.5–5.2)
Sodium: 144 mmol/L (ref 134–144)
Total Protein: 7 g/dL (ref 6.0–8.5)
eGFR: 95 mL/min/{1.73_m2} (ref >59)

## 2020-10-20 LAB — LIPID PANEL
Chol/HDL Ratio: 2.8 ratio (ref 0.0–4.4)
Cholesterol, Total: 208 mg/dL — ABNORMAL HIGH (ref 100–199)
HDL: 75 mg/dL (ref >39)
LDL Chol Calc (NIH): 119 mg/dL — ABNORMAL HIGH (ref 0–99)
Triglycerides: 80 mg/dL (ref 0–149)
VLDL Cholesterol Cal: 14 mg/dL (ref 5–40)

## 2020-10-20 LAB — TSH: TSH: 1.34 u[IU]/mL (ref 0.450–4.500)

## 2020-10-20 LAB — HEMOGLOBIN A1C
Est. average glucose Bld gHb Est-mCnc: 114 mg/dL
Hgb A1c MFr Bld: 5.6 % (ref 4.8–5.6)

## 2020-10-29 ENCOUNTER — Other Ambulatory Visit: Payer: Self-pay

## 2020-10-29 ENCOUNTER — Encounter: Payer: Self-pay | Admitting: Cardiology

## 2020-10-29 ENCOUNTER — Ambulatory Visit (INDEPENDENT_AMBULATORY_CARE_PROVIDER_SITE_OTHER): Payer: Medicare Other | Admitting: Cardiology

## 2020-10-29 VITALS — BP 138/90 | HR 96 | Ht <= 58 in | Wt 125.0 lb

## 2020-10-29 DIAGNOSIS — E7800 Pure hypercholesterolemia, unspecified: Secondary | ICD-10-CM | POA: Diagnosis not present

## 2020-10-29 DIAGNOSIS — R0609 Other forms of dyspnea: Secondary | ICD-10-CM | POA: Diagnosis not present

## 2020-10-29 DIAGNOSIS — I209 Angina pectoris, unspecified: Secondary | ICD-10-CM | POA: Diagnosis not present

## 2020-10-29 MED ORDER — METOPROLOL TARTRATE 100 MG PO TABS: 100.0000 mg | ORAL_TABLET | Freq: Once | ORAL | 0 refills | Status: DC

## 2020-10-29 MED ORDER — IVABRADINE HCL 7.5 MG PO TABS: 15.0000 mg | ORAL_TABLET | Freq: Once | ORAL | 0 refills | Status: AC

## 2020-10-29 NOTE — Progress Notes (Signed)
Cardiology Office Note:    Date:  10/29/2020   ID:  Erin Yu, DOB 1951-01-21, MRN [id number]  PCP:  Erin Milan, MD   [facility] Providers Cardiologist:  Debbe Odea, MD     Referring MD: Erin Milan, MD   Chief Complaint  Patient presents with   NEW patient-Patient c/o SOB with minimal exertion    History of Present Illness:    Erin Yu is a 70 y.o. female with a hx of hyperlipidemia who presents due to shortness of breath.  Patient states having worsening shortness of breath with minimal exertion, ongoing over the past month.  Denies chest pain, denies palpitations dizziness or edema.  Denies smoking.  Symptoms typically occur when she goes up a staircase.  Father had a history of MI in his 23s.  She has no other concerns.    Past Medical History:  Diagnosis Date   Hyperlipidemia    Insomnia     Past Surgical History:  Procedure Laterality Date   ABDOMINAL HYSTERECTOMY     BREAST BIOPSY Bilateral    neg   BUNIONECTOMY     CATARACT EXTRACTION     CESAREAN SECTION     FACIAL COSMETIC SURGERY     PARS PLANA VITRECTOMY W/ REPAIR OF MACULAR HOLE      Current Medications: Current Meds  Medication Sig   Ascorbic Acid (VITAMIN C) 1000 MG tablet Take 1,000 mg by mouth daily.   atorvastatin (LIPITOR) 20 MG tablet Take 1 tablet (20 mg total) by mouth daily.   Calcium Carbonate (CALCIUM 600 PO) Take 1 tablet by mouth daily at 6 (six) AM.   Cholecalciferol (VITAMIN D) 50 MCG (2000 UT) CAPS Take 1 capsule by mouth daily at 2 PM.   diazepam (VALIUM) 5 MG tablet Take 1 tablet (5 mg total) by mouth every 12 (twelve) hours as needed for anxiety.   ivabradine (CORLANOR) 7.5 MG TABS tablet Take 2 tablets (15 mg total) by mouth once for 1 dose. Take 2 hours prior to your CT scan.   metoprolol tartrate (LOPRESSOR) 100 MG tablet Take 1 tablet (100 mg total) by mouth once for 1 dose. Take 2 hours prior to your CT scan.   Multiple Vitamins-Minerals (PRESERVISION  AREDS 2+MULTI VIT PO) Take 1 tablet by mouth daily.   traZODone (DESYREL) 150 MG tablet Take 2 tablets (300 mg total) by mouth at bedtime.     Allergies:   Patient has no known allergies.   Social History   Socioeconomic History   Marital status: Married    Spouse name: Not on file   Number of children: 1   Years of education: Not on file   Highest education level: Not on file  Occupational History   Not on file  Tobacco Use   Smoking status: Never   Smokeless tobacco: Never  Vaping Use   Vaping Use: Never used  Substance and Sexual Activity   Alcohol use: Yes    Comment: social   Drug use: Never   Sexual activity: Not Currently  Other Topics Concern   Not on file  Social History Narrative   Not on file   Social Determinants of Health   Financial Resource Strain: Low Risk    Difficulty of Paying Living Expenses: Not hard at all  Food Insecurity: No Food Insecurity   Worried About Programme researcher, broadcasting/film/video in the Last Year: Never true   Ran Out of Food in the Last Year: Never true  Transportation Needs: No Regulatory affairs officer (Medical): No   Lack of Transportation (Non-Medical): No  Physical Activity: Insufficiently Active   Days of Exercise per Week: 7 days   Minutes of Exercise per Session: 20 min  Stress: Stress Concern Present   Feeling of Stress : Rather much  Social Connections: Moderately Isolated   Frequency of Communication with Friends and Family: More than three times a week   Frequency of Social Gatherings with Friends and Family: Once a week   Attends Religious Services: Never   Database administrator or Organizations: No   Attends Engineer, structural: Never   Marital Status: Married     Family History: [name] family history includes Depression in her mother; Diabetes in her father; Hearing loss in her mother; Heart disease in her father; Hyperlipidemia in her father; Hypertension in her father; Leukemia in her  father. There is no history of Breast cancer.  ROS:   Please see the history of present illness.     All other systems reviewed and are negative.  EKGs/Labs/Other Studies Reviewed:    The following studies were reviewed today:   EKG:  EKG is  ordered today.  The ekg ordered today demonstrates normal sinus rhythm, right bundle branch block.  Recent Labs: 10/19/2020: ALT 31; BUN 17; Creatinine, Ser 0.66; Hemoglobin 14.7; Platelets 247; Potassium 4.8; Sodium 144; TSH 1.340  Recent Lipid Panel    Component Value Date/Time   CHOL 208 (H) 10/19/2020 1048   TRIG 80 10/19/2020 1048   HDL 75 10/19/2020 1048   CHOLHDL 2.8 10/19/2020 1048   CHOLHDL 2.7 10/18/2019 1047   VLDL 9 10/18/2019 1047   LDLCALC 119 (H) 10/19/2020 1048     Risk Assessment/Calculations:          Physical Exam:    VS:  BP 138/90 (BP Location: Left Arm, Patient Position: Sitting, Cuff Size: Normal)   Pulse 96   Ht 4\' 10"  (1.473 m)   Wt 125 lb (56.7 kg)   SpO2 96%   BMI 26.13 kg/m     Wt Readings from Last 3 Encounters:  10/29/20 125 lb (56.7 kg)  10/19/20 123 lb 6.4 oz (56 kg)  05/15/20 123 lb (55.8 kg)     GEN:  Well nourished, well developed in no acute distress HEENT: Normal NECK: No JVD; No carotid bruits LYMPHATICS: No lymphadenopathy CARDIAC: RRR, no murmurs, rubs, gallops RESPIRATORY:  Clear to auscultation without rales, wheezing or rhonchi  ABDOMEN: Soft, non-tender, non-distended MUSCULOSKELETAL:  No edema; No deformity  SKIN: Warm and dry NEUROLOGIC:  Alert and oriented x 3 PSYCHIATRIC:  Normal affect   ASSESSMENT:    1. Dyspnea on exertion   2. Pure hypercholesterolemia   3. Angina pectoris (HCC)    PLAN:    In order of problems listed above:  Dyspnea on exertion, risk factors age, hyperlipidemia.  This could be an anginal equivalent, get echo to evaluate systolic and diastolic function, get coronary CTA to evaluate presence of CAD. Hyperlipidemia, last LDL not at goal,  continue Lipitor 20 mg daily.  Based on CTA findings, consider increasing.   Follow-up after echo and coronary CTA.     Medication Adjustments/Labs and Tests Ordered: Current medicines are reviewed at length with the patient today.  Concerns regarding medicines are outlined above.  Orders Placed This Encounter  Procedures   CT CORONARY MORPH W/CTA COR W/SCORE W/CA W/CM &/OR WO/CM   EKG 12-Lead   ECHOCARDIOGRAM  COMPLETE    Meds ordered this encounter  Medications   metoprolol tartrate (LOPRESSOR) 100 MG tablet    Sig: Take 1 tablet (100 mg total) by mouth once for 1 dose. Take 2 hours prior to your CT scan.    Dispense:  1 tablet    Refill:  0   ivabradine (CORLANOR) 7.5 MG TABS tablet    Sig: Take 2 tablets (15 mg total) by mouth once for 1 dose. Take 2 hours prior to your CT scan.    Dispense:  2 tablet    Refill:  0     Patient Instructions  Medication Instructions:  Your physician recommends that you continue on your current medications as directed. Please refer to the Current Medication list given to you today.  *If you need a refill on your cardiac medications before your next appointment, please call your pharmacy*   Lab Work: BMP to beb If you have labs (blood work) drawn today and your tests are completely normal, you will receive your results only by: MyChart Message (if you have MyChart) OR A paper copy in the mail If you have any lab test that is abnormal or we need to change your treatment, we will call you to review the results.   Testing/Procedures:   Your physician has requested that you have an echocardiogram. Echocardiography is a painless test that uses sound waves to create images of your heart. It provides your doctor with information about the size and shape of your heart and how well your heart's chambers and valves are working. This procedure takes approximately one hour. There are no restrictions for this procedure.  2.  Your physician has  requested that you have cardiac CT. Cardiac computed tomography (CT) is a painless test that uses an x-ray machine to take clear, detailed pictures of your heart.     Your cardiac CT will be scheduled at:   [facility] [address] [place], [place] [address] [phone]  Please arrive 15 mins early for check-in and test prep.    Please follow these instructions carefully (unless otherwise directed):    On the Night Before the Test: Be sure to Drink plenty of water. Do not consume any caffeinated/decaffeinated beverages or chocolate 12 hours prior to your test.    On the Day of the Test: Drink plenty of water until 1 hour prior to the test. Do not eat any food 4 hours prior to the test. You may take your regular medications prior to the test.  Take metoprolol (Lopressor) 100 MG two hours prior to test. Take Ivabrading (Corlanor) 15 MG two hours prior to test. FEMALES- please wear underwire-free bra if available, avoid dresses & tight clothing        After the Test: Drink plenty of water. After receiving IV contrast, you may experience a mild flushed feeling. This is normal. On occasion, you may experience a mild rash up to 24 hours after the test. This is not dangerous. If this occurs, you can take Benadryl 25 mg and increase your fluid intake. If you experience trouble breathing, this can be serious. If it is severe call 911 IMMEDIATELY. If it is mild, please call our office. If you take any of these medications: Glipizide/Metformin, Avandament, Glucavance, please do not take 48 hours after completing test unless otherwise instructed.  Please allow 2-4 weeks for scheduling of routine cardiac CTs. Some insurance companies require a pre-authorization which may delay scheduling of this  test.   For non-scheduling related questions, please contact the cardiac imaging nurse navigator should you have any questions/concerns: Rockwell [place], Cardiac Imaging Nurse Navigator Larey Brick, Cardiac Imaging Nurse Navigator [facility] Direct Office Dial: [phone]   For scheduling needs, including cancellations and rescheduling, please call [place], [phone].     Follow-Up: At [facility], you and your health needs are our priority.  As part of our continuing mission to provide you with exceptional heart care, we have created designated Provider Care Teams.  These Care Teams include your primary Cardiologist (physician) and Advanced Practice Providers (APPs -  Physician Assistants and Nurse Practitioners) who all work together to provide you with the care you need, when you need it.  We recommend signing up for the patient portal called "MyChart".  Sign up information is provided on this After Visit Summary.  MyChart is used to connect with patients for Virtual Visits (Telemedicine).  Patients are able to view lab/test results, encounter notes, upcoming appointments, etc.  Non-urgent messages can be sent to your provider as well.   To learn more about what you can do with MyChart, go to [email or web address].    Your next appointment:   Follow up after Echo   The format for your next appointment:   In Person  Provider:   Debbe Odea, MD   Other Instructions    Signed, Debbe Odea, MD  10/29/2020 5:08 PM    [facility]

## 2020-10-29 NOTE — Patient Instructions (Signed)
Medication Instructions:  Your physician recommends that you continue on your current medications as directed. Please refer to the Current Medication list given to you today.  *If you need a refill on your cardiac medications before your next appointment, please call your pharmacy*   Lab Work: BMP to beb If you have labs (blood work) drawn today and your tests are completely normal, you will receive your results only by: MyChart Message (if you have MyChart) OR A paper copy in the mail If you have any lab test that is abnormal or we need to change your treatment, we will call you to review the results.   Testing/Procedures:   Your physician has requested that you have an echocardiogram. Echocardiography is a painless test that uses sound waves to create images of your heart. It provides your doctor with information about the size and shape of your heart and how well your heart's chambers and valves are working. This procedure takes approximately one hour. There are no restrictions for this procedure.  2.  Your physician has requested that you have cardiac CT. Cardiac computed tomography (CT) is a painless test that uses an x-ray machine to take clear, detailed pictures of your heart.     Your cardiac CT will be scheduled at:   [facility] [address] [place], [place] [address] [phone]  Please arrive 15 mins early for check-in and test prep.    Please follow these instructions carefully (unless otherwise directed):    On the Night Before the Test: Be sure to Drink plenty of water. Do not consume any caffeinated/decaffeinated beverages or chocolate 12 hours prior to your test.    On the Day of the Test: Drink plenty of water until 1 hour prior to the test. Do not eat any food 4 hours prior to the test. You may take your regular medications prior to the test.  Take metoprolol (Lopressor) 100 MG two hours prior to  test. Take Ivabrading (Corlanor) 15 MG two hours prior to test. FEMALES- please wear underwire-free bra if available, avoid dresses & tight clothing        After the Test: Drink plenty of water. After receiving IV contrast, you may experience a mild flushed feeling. This is normal. On occasion, you may experience a mild rash up to 24 hours after the test. This is not dangerous. If this occurs, you can take Benadryl 25 mg and increase your fluid intake. If you experience trouble breathing, this can be serious. If it is severe call 911 IMMEDIATELY. If it is mild, please call our office. If you take any of these medications: Glipizide/Metformin, Avandament, Glucavance, please do not take 48 hours after completing test unless otherwise instructed.  Please allow 2-4 weeks for scheduling of routine cardiac CTs. Some insurance companies require a pre-authorization which may delay scheduling of this test.   For non-scheduling related questions, please contact the cardiac imaging nurse navigator should you have any questions/concerns: Rockwell Alexandria, Cardiac Imaging Nurse Navigator Larey Brick, Cardiac Imaging Nurse Navigator [facility] Direct Office Dial: [phone]   For scheduling needs, including cancellations and rescheduling, please call [place], [phone].     Follow-Up: At [facility], you and your health needs are our priority.  As part of our continuing mission to provide you with exceptional heart care, we have created designated Provider Care Teams.  These Care Teams include your primary Cardiologist (physician) and Advanced Practice Providers (APPs -  Physician Assistants and Nurse Practitioners) who all work together to provide you with the care you need, when you need it.  We recommend signing up for the patient portal called "MyChart".  Sign up information is provided on this After Visit Summary.  MyChart is used to connect with patients for  Virtual Visits (Telemedicine).  Patients are able to view lab/test results, encounter notes, upcoming appointments, etc.  Non-urgent messages can be sent to your provider as well.   To learn more about what you can do with MyChart, go to [email or web address].    Your next appointment:   Follow up after Echo   The format for your next appointment:   In Person  Provider:   Debbe Odea, MD   Other Instructions

## 2020-11-07 ENCOUNTER — Ambulatory Visit: Payer: Medicare Other

## 2020-11-21 ENCOUNTER — Telehealth (HOSPITAL_COMMUNITY): Payer: Self-pay | Admitting: *Deleted

## 2020-11-21 ENCOUNTER — Telehealth (HOSPITAL_COMMUNITY): Payer: Self-pay | Admitting: Emergency Medicine

## 2020-11-21 ENCOUNTER — Other Ambulatory Visit (HOSPITAL_COMMUNITY): Payer: Self-pay | Admitting: *Deleted

## 2020-11-21 DIAGNOSIS — Z01812 Encounter for preprocedural laboratory examination: Secondary | ICD-10-CM

## 2020-11-21 NOTE — Telephone Encounter (Signed)
Attempted to call patient regarding upcoming cardiac CT appointment. °Left message on voicemail with name and callback number °Quinzell Malcomb RN Navigator Cardiac Imaging °[facility] °[phone] Office °[phone] Cell ° °

## 2020-11-21 NOTE — Telephone Encounter (Signed)
Patient returning call regarding upcoming cardiac imaging study; pt verbalizes understanding of appt date/time, parking situation and where to check in, pre-test NPO status and medications ordered, and verified current allergies; name and call back number provided for further questions should they arise  Larey Brick RN Navigator Cardiac Imaging [facility] [phone] office [phone] cell  Patient states she will prefer to go get labs from [facility] today.  Order for BMET placed.  Patient will take 100mg  metoprolol tartrate and 15mg  ivabradine two hours prior to cardiac CT scan.

## 2020-11-22 ENCOUNTER — Other Ambulatory Visit: Payer: Self-pay

## 2020-11-22 ENCOUNTER — Ambulatory Visit
Admission: RE | Admit: 2020-11-22 | Discharge: 2020-11-22 | Disposition: A | Discharge status: Home / Self Care | Payer: Medicare Other | Source: Ambulatory Visit | Attending: Cardiology | Admitting: Cardiology

## 2020-11-22 DIAGNOSIS — I209 Angina pectoris, unspecified: Secondary | ICD-10-CM | POA: Diagnosis present

## 2020-11-22 DIAGNOSIS — R0609 Other forms of dyspnea: Secondary | ICD-10-CM | POA: Diagnosis present

## 2020-11-22 LAB — BASIC METABOLIC PANEL
BUN/Creatinine Ratio: 29 — ABNORMAL HIGH (ref 12–28)
BUN: 19 mg/dL (ref 8–27)
CO2: 24 mmol/L (ref 20–29)
Calcium: 10.2 mg/dL (ref 8.7–10.3)
Chloride: 101 mmol/L (ref 96–106)
Creatinine, Ser: 0.66 mg/dL (ref 0.57–1.00)
Glucose: 93 mg/dL (ref 70–99)
Potassium: 5.1 mmol/L (ref 3.5–5.2)
Sodium: 141 mmol/L (ref 134–144)
eGFR: 95 mL/min/{1.73_m2} (ref >59)

## 2020-11-22 MED ORDER — NITROGLYCERIN 0.4 MG SL SUBL
0.8000 mg | SUBLINGUAL_TABLET | Freq: Once | SUBLINGUAL | Status: AC
  Administered 2020-11-22: 0.8 mg via SUBLINGUAL

## 2020-11-22 MED ORDER — METOPROLOL TARTRATE 5 MG/5ML IV SOLN
10.0000 mg | Freq: Once | INTRAVENOUS | Status: AC
  Administered 2020-11-22: 10 mg via INTRAVENOUS

## 2020-11-22 MED ORDER — IOHEXOL 350 MG/ML SOLN
75.0000 mL | Freq: Once | INTRAVENOUS | Status: AC | PRN
  Administered 2020-11-22: 75 mL via INTRAVENOUS

## 2020-11-30 ENCOUNTER — Ambulatory Visit (INDEPENDENT_AMBULATORY_CARE_PROVIDER_SITE_OTHER): Payer: Medicare Other

## 2020-11-30 ENCOUNTER — Other Ambulatory Visit: Payer: Self-pay

## 2020-11-30 DIAGNOSIS — R0609 Other forms of dyspnea: Secondary | ICD-10-CM | POA: Diagnosis not present

## 2020-11-30 DIAGNOSIS — I209 Angina pectoris, unspecified: Secondary | ICD-10-CM | POA: Diagnosis not present

## 2020-11-30 LAB — ECHOCARDIOGRAM COMPLETE
AR max vel: 2.68 cm2
AV Area VTI: 2.79 cm2
AV Area mean vel: 2.63 cm2
AV Mean grad: 3 mmHg
AV Peak grad: 6.6 mmHg
Ao pk vel: 1.28 m/s
Area-P 1/2: 2.91 cm2
Calc EF: 64.6 %
S' Lateral: 2.8 cm
Single Plane A2C EF: 65.8 %
Single Plane A4C EF: 61.5 %

## 2020-12-05 ENCOUNTER — Telehealth: Payer: Self-pay

## 2020-12-05 NOTE — Telephone Encounter (Signed)
Called patient to schedule Medicare Annual Wellness Visit with Nurse Health Advisor.   If patient returns call, please schedule for any date.  Last AWV completed: 11/07/19  Appointment length should be 40 minutes  Thank you,  Reather Littler LPN [phone]

## 2020-12-06 ENCOUNTER — Encounter: Payer: Self-pay | Admitting: Cardiology

## 2020-12-06 ENCOUNTER — Ambulatory Visit (INDEPENDENT_AMBULATORY_CARE_PROVIDER_SITE_OTHER): Payer: Medicare Other | Admitting: Cardiology

## 2020-12-06 ENCOUNTER — Other Ambulatory Visit: Payer: Self-pay

## 2020-12-06 VITALS — BP 120/70 | HR 83 | Ht 59.0 in | Wt 122.0 lb

## 2020-12-06 DIAGNOSIS — E782 Mixed hyperlipidemia: Secondary | ICD-10-CM

## 2020-12-06 DIAGNOSIS — E7800 Pure hypercholesterolemia, unspecified: Secondary | ICD-10-CM

## 2020-12-06 DIAGNOSIS — R0609 Other forms of dyspnea: Secondary | ICD-10-CM

## 2020-12-06 MED ORDER — ATORVASTATIN CALCIUM 40 MG PO TABS: 40.0000 mg | ORAL_TABLET | Freq: Every day | ORAL | 1 refills | Status: DC

## 2020-12-06 NOTE — Progress Notes (Signed)
Cardiology Office Note:    Date:  12/06/2020   ID:  Erin Yu, DOB 01/18/1951, MRN [id number]  PCP:  Erin Milan, MD   [facility] Providers Cardiologist:  Debbe Odea, MD     Referring MD: Erin Milan, MD   Chief Complaint  Patient presents with   OTher    Follow up post Echo and CT -- Meds reviewed verbally with patient.     History of Present Illness:    Erin Yu is a 70 y.o. female with a hx of hyperlipidemia who presents for follow-up.  She was last seen due to shortness of breath with exertion.  Cardiac work-up was performed with echocardiogram and coronary CTA.  She now presents for results.  States her shortness of breath overall has improved, compliant with medications/Lipitor as prescribed.  Denies chest pain.  Past Medical History:  Diagnosis Date   Hyperlipidemia    Insomnia     Past Surgical History:  Procedure Laterality Date   ABDOMINAL HYSTERECTOMY     BREAST BIOPSY Bilateral    neg   BUNIONECTOMY     CATARACT EXTRACTION     CESAREAN SECTION     FACIAL COSMETIC SURGERY     PARS PLANA VITRECTOMY W/ REPAIR OF MACULAR HOLE      Current Medications: Current Meds  Medication Sig   Ascorbic Acid (VITAMIN C) 1000 MG tablet Take 1,000 mg by mouth daily.   Calcium Carbonate (CALCIUM 600 PO) Take 1 tablet by mouth daily at 6 (six) AM.   Cholecalciferol (VITAMIN D) 50 MCG (2000 UT) CAPS Take 1 capsule by mouth daily at 2 PM.   diazepam (VALIUM) 5 MG tablet Take 5 mg by mouth as needed for anxiety.   Multiple Vitamins-Minerals (PRESERVISION AREDS 2+MULTI VIT PO) Take 1 tablet by mouth daily.   traZODone (DESYREL) 150 MG tablet Take 2 tablets (300 mg total) by mouth at bedtime.   [DISCONTINUED] atorvastatin (LIPITOR) 20 MG tablet Take 1 tablet (20 mg total) by mouth daily.     Allergies:   Patient has no known allergies.   Social History   Socioeconomic History   Marital status: Married    Spouse name: Not on file   Number of  children: 1   Years of education: Not on file   Highest education level: Not on file  Occupational History   Not on file  Tobacco Use   Smoking status: Never   Smokeless tobacco: Never  Vaping Use   Vaping Use: Never used  Substance and Sexual Activity   Alcohol use: Yes    Comment: social   Drug use: Never   Sexual activity: Not Currently  Other Topics Concern   Not on file  Social History Narrative   Not on file   Social Determinants of Health   Financial Resource Strain: Not on file  Food Insecurity: Not on file  Transportation Needs: Not on file  Physical Activity: Not on file  Stress: Not on file  Social Connections: Not on file     Family History: [name] family history includes Depression in her mother; Diabetes in her father; Hearing loss in her mother; Heart disease in her father; Hyperlipidemia in her father; Hypertension in her father; Leukemia in her father. There is no history of Breast cancer.  ROS:   Please see the history of present illness.     All other systems reviewed and are negative.  EKGs/Labs/Other Studies Reviewed:    The following studies  were reviewed today:   EKG:  EKG not ordered today.   Recent Labs: 10/19/2020: ALT 31; Hemoglobin 14.7; Platelets 247; TSH 1.340 11/21/2020: BUN 19; Creatinine, Ser 0.66; Potassium 5.1; Sodium 141  Recent Lipid Panel    Component Value Date/Time   CHOL 208 (H) 10/19/2020 1048   TRIG 80 10/19/2020 1048   HDL 75 10/19/2020 1048   CHOLHDL 2.8 10/19/2020 1048   CHOLHDL 2.7 10/18/2019 1047   VLDL 9 10/18/2019 1047   LDLCALC 119 (H) 10/19/2020 1048     Risk Assessment/Calculations:          Physical Exam:    VS:  BP 120/70 (BP Location: Left Arm, Patient Position: Sitting, Cuff Size: Normal)   Pulse 83   Ht 4\' 11"  (1.499 m)   Wt 122 lb (55.3 kg)   SpO2 97%   BMI 24.64 kg/m     Wt Readings from Last 3 Encounters:  12/06/20 122 lb (55.3 kg)  10/29/20 125 lb (56.7 kg)  10/19/20 123  lb 6.4 oz (56 kg)     GEN:  Well nourished, well developed in no acute distress HEENT: Normal NECK: No JVD; No carotid bruits LYMPHATICS: No lymphadenopathy CARDIAC: RRR, no murmurs, rubs, gallops RESPIRATORY:  Clear to auscultation without rales, wheezing or rhonchi  ABDOMEN: Soft, non-tender, non-distended MUSCULOSKELETAL:  No edema; No deformity  SKIN: Warm and dry NEUROLOGIC:  Alert and oriented x 3 PSYCHIATRIC:  Normal affect   ASSESSMENT:    1. Dyspnea on exertion   2. Pure hypercholesterolemia   3. Mixed hyperlipidemia     PLAN:    In order of problems listed above:  Dyspnea on exertion, risk factors age, hyperlipidemia.  Echocardiogram showed normal EF 60 to 65%, impaired relaxation, coronary CTA with calcium score of 0, no evidence of CAD.  Patient made aware of results, reassured. Hyperlipidemia, last LDL not at goal, increase Lipitor to 40 mg daily repeat fasting lipid profile in 6 months.   Follow-up in 6 months.     Medication Adjustments/Labs and Tests Ordered: Current medicines are reviewed at length with the patient today.  Concerns regarding medicines are outlined above.  No orders of the defined types were placed in this encounter.   Meds ordered this encounter  Medications   atorvastatin (LIPITOR) 40 MG tablet    Sig: Take 1 tablet (40 mg total) by mouth daily.    Dispense:  90 tablet    Refill:  1      Patient Instructions  Medication Instructions:   Your physician has recommended you make the following change in your medication:    INCREASE your Lipitor to 40 MG once a day.   *If you need a refill on your cardiac medications before your next appointment, please call your pharmacy*   Lab Work:  Your physician recommends that you return for a FASTING lipid profile: The week prior to your 6 month follow up.  - You will need to be fasting. Please do not have anything to eat or drink after midnight the morning you have the lab work. You  may only have water or black coffee with no cream or sugar.   We will call you closer to your appointment time and schedule you in our office for this lab draw.    Testing/Procedures: None ordered   Follow-Up: At [facility], you and your health needs are our priority.  As part of our continuing mission to provide you with exceptional heart care, we have created  designated Provider Care Teams.  These Care Teams include your primary Cardiologist (physician) and Advanced Practice Providers (APPs -  Physician Assistants and Nurse Practitioners) who all work together to provide you with the care you need, when you need it.  We recommend signing up for the patient portal called "MyChart".  Sign up information is provided on this After Visit Summary.  MyChart is used to connect with patients for Virtual Visits (Telemedicine).  Patients are able to view lab/test results, encounter notes, upcoming appointments, etc.  Non-urgent messages can be sent to your provider as well.   To learn more about what you can do with MyChart, go to [email or web address].    Your next appointment:   6 month(s)  The format for your next appointment:   In Person  Provider:   You may see Debbe Odea, MD or one of the following Advanced Practice Providers on your designated Care Team:   Nicolasa Ducking, NP Eula Listen, PA-C Cadence Fransico Michael, [place]    Other Instructions    Signed, Debbe Odea, MD  12/06/2020 12:36 PM    [facility]

## 2020-12-06 NOTE — Patient Instructions (Signed)
Medication Instructions:   Your physician has recommended you make the following change in your medication:    INCREASE your Lipitor to 40 MG once a day.   *If you need a refill on your cardiac medications before your next appointment, please call your pharmacy*   Lab Work:  Your physician recommends that you return for a FASTING lipid profile: The week prior to your 6 month follow up.  - You will need to be fasting. Please do not have anything to eat or drink after midnight the morning you have the lab work. You may only have water or black coffee with no cream or sugar.   We will call you closer to your appointment time and schedule you in our office for this lab draw.    Testing/Procedures: None ordered   Follow-Up: At [facility], you and your health needs are our priority.  As part of our continuing mission to provide you with exceptional heart care, we have created designated Provider Care Teams.  These Care Teams include your primary Cardiologist (physician) and Advanced Practice Providers (APPs -  Physician Assistants and Nurse Practitioners) who all work together to provide you with the care you need, when you need it.  We recommend signing up for the patient portal called "MyChart".  Sign up information is provided on this After Visit Summary.  MyChart is used to connect with patients for Virtual Visits (Telemedicine).  Patients are able to view lab/test results, encounter notes, upcoming appointments, etc.  Non-urgent messages can be sent to your provider as well.   To learn more about what you can do with MyChart, go to [email or web address].    Your next appointment:   6 month(s)  The format for your next appointment:   In Person  Provider:   You may see Debbe Odea, MD or one of the following Advanced Practice Providers on your designated Care Team:   Nicolasa Ducking, NP Eula Listen, PA-C Cadence Fransico Michael, [place]    Other Instructions

## 2020-12-12 ENCOUNTER — Other Ambulatory Visit: Payer: Self-pay

## 2020-12-12 DIAGNOSIS — E782 Mixed hyperlipidemia: Secondary | ICD-10-CM

## 2020-12-24 ENCOUNTER — Telehealth: Payer: Self-pay | Admitting: Cardiology

## 2020-12-24 DIAGNOSIS — E782 Mixed hyperlipidemia: Secondary | ICD-10-CM

## 2020-12-24 MED ORDER — ATORVASTATIN CALCIUM 40 MG PO TABS: 40.0000 mg | ORAL_TABLET | Freq: Every day | ORAL | 1 refills | Status: DC

## 2020-12-24 NOTE — Telephone Encounter (Signed)
Requested Prescriptions   Signed Prescriptions Disp Refills   atorvastatin (LIPITOR) 40 MG tablet 90 tablet 1    Sig: Take 1 tablet (40 mg total) by mouth daily.    Authorizing Provider: Debbe Odea    Ordering User: Thayer Headings, Merisa Julio L

## 2020-12-24 NOTE — Telephone Encounter (Signed)
*  STAT* If patient is at the pharmacy, call can be transferred to refill team.   1. Which medications need to be refilled? (please list name of each medication and dose if known)  atorvastatin 40 mg po q d   2. Which pharmacy/location (including street and city if local pharmacy) is medication to be sent to? [facility] NCPDP ID [id number] number [phone]  3. Do they need a 30 day or 90 day supply? 90

## 2021-01-04 ENCOUNTER — Other Ambulatory Visit: Payer: Self-pay

## 2021-01-04 ENCOUNTER — Ambulatory Visit (INDEPENDENT_AMBULATORY_CARE_PROVIDER_SITE_OTHER): Payer: Medicare Other | Admitting: Internal Medicine

## 2021-01-04 ENCOUNTER — Encounter: Payer: Self-pay | Admitting: Internal Medicine

## 2021-01-04 VITALS — BP 112/80 | HR 101 | Temp 99.0°F | Ht 59.0 in | Wt 122.0 lb

## 2021-01-04 DIAGNOSIS — U071 COVID-19: Secondary | ICD-10-CM | POA: Diagnosis not present

## 2021-01-04 DIAGNOSIS — I209 Angina pectoris, unspecified: Secondary | ICD-10-CM | POA: Diagnosis not present

## 2021-01-04 LAB — POC COVID19 BINAXNOW: SARS Coronavirus 2 Ag: POSITIVE — AB

## 2021-01-04 LAB — POCT INFLUENZA A/B
Influenza A, POC: NEGATIVE
Influenza B, POC: NEGATIVE

## 2021-01-04 MED ORDER — MOLNUPIRAVIR EUA 200MG CAPSULE: 4.0000 | ORAL_CAPSULE | Freq: Two times a day (BID) | ORAL | 0 refills | Status: AC

## 2021-01-04 MED ORDER — PROMETHAZINE-DM 6.25-15 MG/5ML PO SYRP: 5.0000 mL | ORAL_SOLUTION | Freq: Four times a day (QID) | ORAL | 0 refills | Status: DC | PRN

## 2021-01-04 NOTE — Progress Notes (Signed)
Date:  01/04/2021   Name:  Erin Yu   DOB:  11/29/1950   MRN:  [id number]   Chief Complaint: Sore Throat (4 day, negative covid 3 days ago )  URI  This is a new problem. The current episode started in the past 7 days. Maximum temperature: 103 F. The fever has been present for 1 to 2 days. Associated symptoms include coughing, diarrhea, a sore throat, vomiting (resolved) and wheezing. Pertinent negatives include no chest pain. Associated symptoms comments: SOB. She has tried nothing for the symptoms.  She was exposed to someone over the weekend with Covid.  Lab Results  Component Value Date   NA 141 11/21/2020   K 5.1 11/21/2020   CO2 24 11/21/2020   GLUCOSE 93 11/21/2020   BUN 19 11/21/2020   CREATININE 0.66 11/21/2020   CALCIUM 10.2 11/21/2020   EGFR 95 11/21/2020   GFRNONAA 92 03/13/2020   Lab Results  Component Value Date   CHOL 208 (H) 10/19/2020   HDL 75 10/19/2020   LDLCALC 119 (H) 10/19/2020   TRIG 80 10/19/2020   CHOLHDL 2.8 10/19/2020   Lab Results  Component Value Date   TSH 1.340 10/19/2020   Lab Results  Component Value Date   HGBA1C 5.6 10/19/2020   Lab Results  Component Value Date   WBC 5.0 10/19/2020   HGB 14.7 10/19/2020   HCT 43.7 10/19/2020   MCV 91 10/19/2020   PLT 247 10/19/2020   Lab Results  Component Value Date   ALT 31 10/19/2020   AST 19 10/19/2020   ALKPHOS 53 10/19/2020   BILITOT 0.6 10/19/2020   Lab Results  Component Value Date   VD25OH 74.6 10/19/2020     Review of Systems  Constitutional:  Positive for chills, diaphoresis, fatigue and fever.  HENT:  Positive for sore throat. Negative for trouble swallowing.   Respiratory:  Positive for cough, shortness of breath and wheezing. Negative for chest tightness.   Cardiovascular:  Negative for chest pain and palpitations.  Gastrointestinal:  Positive for diarrhea and vomiting (resolved).  Psychiatric/Behavioral:  Positive for sleep disturbance. Negative for dysphoric  mood. The patient is not nervous/anxious.    Patient Active Problem List   Diagnosis Date Noted   Right bundle branch block (RBBB) 05/15/2020   Osteopenia determined by x-ray 02/08/2020   Elevated glucose 10/19/2019   Primary insomnia 10/18/2019   HNP (herniated nucleus pulposus), lumbar 08/09/2019   Primary osteoarthritis of both first carpometacarpal joints 08/09/2019   OAB (overactive bladder) 08/09/2019   Mixed hyperlipidemia 08/09/2019   Macular hole of right eye 08/22/2017   Pseudophakia of both eyes 08/22/2017    No Known Allergies  Past Surgical History:  Procedure Laterality Date   ABDOMINAL HYSTERECTOMY     BREAST BIOPSY Bilateral    neg   BUNIONECTOMY     CATARACT EXTRACTION     CESAREAN SECTION     FACIAL COSMETIC SURGERY     PARS PLANA VITRECTOMY W/ REPAIR OF MACULAR HOLE      Social History   Tobacco Use   Smoking status: Never   Smokeless tobacco: Never  Vaping Use   Vaping Use: Never used  Substance Use Topics   Alcohol use: Yes    Comment: social   Drug use: Never     Medication list has been reviewed and updated.  Current Meds  Medication Sig   Ascorbic Acid (VITAMIN C) 1000 MG tablet Take 1,000 mg by mouth daily.  atorvastatin (LIPITOR) 40 MG tablet Take 1 tablet (40 mg total) by mouth daily.   Calcium Carbonate (CALCIUM 600 PO) Take 1 tablet by mouth daily at 6 (six) AM.   Cholecalciferol (VITAMIN D) 50 MCG (2000 UT) CAPS Take 1 capsule by mouth daily at 2 PM.   diazepam (VALIUM) 5 MG tablet Take 5 mg by mouth as needed for anxiety.   Multiple Vitamins-Minerals (PRESERVISION AREDS 2+MULTI VIT PO) Take 1 tablet by mouth daily.   traZODone (DESYREL) 150 MG tablet Take 2 tablets (300 mg total) by mouth at bedtime.    PHQ 2/9 Scores 01/04/2021 10/19/2020 05/15/2020 03/13/2020  PHQ - 2 Score 0 2 0 0  PHQ- 9 Score 2 5 0 0    GAD 7 : Generalized Anxiety Score 01/04/2021 10/19/2020 05/15/2020 03/13/2020  Nervous, Anxious, on Edge 1 1 0 0   Control/stop worrying 1 3 0 0  Worry too much - different things 1 3 0 0  Trouble relaxing 0 0 0 0  Restless 0 0 0 0  Easily annoyed or irritable 0 2 0 0  Afraid - awful might happen 0 0 0 0  Total GAD 7 Score 3 9 0 0  Anxiety Difficulty Not difficult at all Not difficult at all Not difficult at all Not difficult at all    BP Readings from Last 3 Encounters:  01/04/21 112/80  12/06/20 120/70  11/22/20 109/69    Physical Exam Constitutional:      Appearance: She is ill-appearing.  HENT:     Right Ear: Tympanic membrane is retracted. Tympanic membrane is not erythematous.     Left Ear: Tympanic membrane is not erythematous or retracted.     Mouth/Throat:     Pharynx: No posterior oropharyngeal erythema.     Tonsils: No tonsillar exudate.  Eyes:     Conjunctiva/sclera: Conjunctivae normal.  Neck:     Thyroid: No thyromegaly.  Cardiovascular:     Rate and Rhythm: Normal rate and regular rhythm.     Heart sounds: Normal heart sounds.  Pulmonary:     Effort: Pulmonary effort is normal.     Breath sounds: Normal breath sounds. No wheezing or rhonchi.  Abdominal:     Palpations: Abdomen is soft.  Musculoskeletal:     Cervical back: Normal range of motion.     Right lower leg: No edema.     Left lower leg: No edema.  Lymphadenopathy:     Cervical: No cervical adenopathy.  Skin:    General: Skin is warm.  Neurological:     Mental Status: She is alert.    Wt Readings from Last 3 Encounters:  01/04/21 122 lb (55.3 kg)  12/06/20 122 lb (55.3 kg)  10/29/20 125 lb (56.7 kg)    BP 112/80   Pulse (!) 101   Temp 99 F (37.2 C) (Oral)   Ht _0  (1.499 m)   Wt 122 lb (55.3 kg)   SpO2 97%   BMI 24.64 kg/m   Assessment and Plan: 1. COVID-19 virus infection Recommend fluids, rest, tylenol, cough suppressants Quarantine for 5 days Call if diarrhea worsens, otherwise no specific treatment - POC COVID-19 BinaxNow - + - POCT Influenza A/B - negative - molnupiravir EUA  (LAGEVRIO) 200 mg CAPS capsule; Take 4 capsules (800 mg total) by mouth 2 (two) times daily for 5 days.  Dispense: 40 capsule; Refill: 0 - promethazine-dextromethorphan (PROMETHAZINE-DM) 6.25-15 MG/5ML syrup; Take 5 mLs by mouth 4 (four) times daily as needed  for cough.  Dispense: 180 mL; Refill: 0   Partially dictated using Editor, commissioning. Any errors are unintentional.  Halina Maidens, MD [facility]  01/04/2021

## 2021-01-16 ENCOUNTER — Ambulatory Visit
Admission: EM | Admit: 2021-01-16 | Discharge: 2021-01-16 | Disposition: A | Discharge status: Home / Self Care | Payer: Medicare Other | Attending: Internal Medicine | Admitting: Internal Medicine

## 2021-01-16 ENCOUNTER — Other Ambulatory Visit: Payer: Self-pay

## 2021-01-16 DIAGNOSIS — Z8616 Personal history of COVID-19: Secondary | ICD-10-CM | POA: Diagnosis not present

## 2021-01-16 DIAGNOSIS — Z7189 Other specified counseling: Secondary | ICD-10-CM | POA: Diagnosis not present

## 2021-01-16 NOTE — Discharge Instructions (Signed)
You have satisfied the CDC guidelines for ending isolation. Please avoid covid testing for 3 months You can continue your routine activities If you have any further questions or concerns, please reach out to the urgent care

## 2021-01-16 NOTE — ED Triage Notes (Signed)
Pt here with C/O fatigue, tested positive for Covid on 01/04/2021. Pt wants to know if she still has Coivd.

## 2021-01-17 ENCOUNTER — Telehealth: Payer: Self-pay | Admitting: Internal Medicine

## 2021-01-17 NOTE — Telephone Encounter (Signed)
Copied from CRM [id number]. Topic: Medicare AWV >> Jan 17, 2021 10:03 AM Claudette Laws R wrote: Reason for CRM:  Left message for patient to call back and schedule Medicare Annual Wellness Visit (AWV) in office.   If unable to come into the office for AWV,  please offer to do virtually or by telephone.  Last AWV: 11/07/2019  Please schedule at anytime with [facility] Health Advisor.      40 Minutes appointment   Any questions, please call me at [phone]

## 2021-01-17 NOTE — ED Provider Notes (Signed)
[facility]    CSN: [id number] Arrival date & time: 01/16/21  1638      History   Chief Complaint Chief Complaint  Patient presents with   Covid    HPI Erin Yu is a 70 y.o. female comes to urgent care to seek further clarification on whether she can travel for the Christmas holidays.  Patient was diagnosed with COVID 19 on January 04, 2021.  Patient has completed her quarantine.  Current complaints is fatigue.  No fever or chills.  No cough or sputum production.  She is tested positive on the home COVID test.  She is requesting PCR test in order to feel comfortable to travel.  Before she leaves for the Christmas holidays.  HPI  Past Medical History:  Diagnosis Date   Hyperlipidemia    Insomnia     Patient Active Problem List   Diagnosis Date Noted   Right bundle branch block (RBBB) 05/15/2020   Osteopenia determined by x-ray 02/08/2020   Elevated glucose 10/19/2019   Primary insomnia 10/18/2019   HNP (herniated nucleus pulposus), lumbar 08/09/2019   Primary osteoarthritis of both first carpometacarpal joints 08/09/2019   OAB (overactive bladder) 08/09/2019   Mixed hyperlipidemia 08/09/2019   Macular hole of right eye 08/22/2017   Pseudophakia of both eyes 08/22/2017    Past Surgical History:  Procedure Laterality Date   ABDOMINAL HYSTERECTOMY     BREAST BIOPSY Bilateral    neg   BUNIONECTOMY     CATARACT EXTRACTION     CESAREAN SECTION     FACIAL COSMETIC SURGERY     PARS PLANA VITRECTOMY W/ REPAIR OF MACULAR HOLE      OB History   No obstetric history on file.      Home Medications    Prior to Admission medications   Medication Sig Start Date End Date Taking? Authorizing Provider  Ascorbic Acid (VITAMIN C) 1000 MG tablet Take 1,000 mg by mouth daily.   Yes [provider]  atorvastatin (LIPITOR) 40 MG tablet Take 1 tablet (40 mg total) by mouth daily. 12/24/20  Yes Agbor-Etang, Arlys John, MD  Calcium Carbonate (CALCIUM 600 PO)  Take 1 tablet by mouth daily at 6 (six) AM.   Yes [provider]  Cholecalciferol (VITAMIN D) 50 MCG (2000 UT) CAPS Take 1 capsule by mouth daily at 2 PM.   Yes [provider]  diazepam (VALIUM) 5 MG tablet Take 5 mg by mouth as needed for anxiety.   Yes [provider]  Multiple Vitamins-Minerals (PRESERVISION AREDS 2+MULTI VIT PO) Take 1 tablet by mouth daily.   Yes [provider]  traZODone (DESYREL) 150 MG tablet Take 2 tablets (300 mg total) by mouth at bedtime. 10/19/20  Yes Reubin Milan, MD    Family History Family History  Problem Relation Age of Onset   Depression Mother    Hearing loss Mother    Leukemia Father    Heart disease Father    Hypertension Father    Hyperlipidemia Father    Diabetes Father    Breast cancer Neg Hx     Social History Social History   Tobacco Use   Smoking status: Never   Smokeless tobacco: Never  Vaping Use   Vaping Use: Never used  Substance Use Topics   Alcohol use: Yes    Comment: social   Drug use: Never     Allergies   Patient has no known allergies.   Review of Systems Review of Systems  Constitutional:  Positive for fatigue.  HENT: Negative.    Respiratory: Negative.    Gastrointestinal: Negative.   Neurological: Negative.     Physical Exam Triage Vital Signs ED Triage Vitals  Enc Vitals Group     BP 01/16/21 1739 (!) 146/70     Pulse Rate 01/16/21 1739 92     Resp 01/16/21 1739 18     Temp 01/16/21 1739 99 F (37.2 C)     Temp Source 01/16/21 1739 Oral     SpO2 01/16/21 1739 97 %     Weight 01/16/21 1737 121 lb 7.7 oz (55.1 kg)     Height 01/16/21 1737 4\' 11"  (1.499 m)     Head Circumference --      Peak Flow --      Pain Score 01/16/21 1737 0     Pain Loc --      Pain Edu? --      Excl. in GC? --    No data found.  Updated Vital Signs BP (!) 146/70 (BP Location: Left Arm)    Pulse 92    Temp 99 F (37.2 C) (Oral)    Resp 18    Ht 4\' 11"  (1.499 m)    Wt 55.1  kg    SpO2 97%    BMI 24.54 kg/m   Visual Acuity Right Eye Distance:   Left Eye Distance:   Bilateral Distance:    Right Eye Near:   Left Eye Near:    Bilateral Near:     Physical Exam Vitals and nursing note reviewed.  Constitutional:      General: She is not in acute distress.    Appearance: She is not ill-appearing.  Cardiovascular:     Rate and Rhythm: Normal rate and regular rhythm.     Pulses: Normal pulses.     Heart sounds: Normal heart sounds.  Pulmonary:     Effort: Pulmonary effort is normal.     Breath sounds: Normal breath sounds.  Abdominal:     General: Bowel sounds are normal.     Palpations: Abdomen is soft.  Neurological:     Mental Status: She is alert.     UC Treatments / Results  Labs (all labs ordered are listed, but only abnormal results are displayed) Labs Reviewed - No data to display  EKG   Radiology No results found.  Procedures Procedures (including critical care time)  Medications Ordered in UC Medications - No data to display  Initial Impression / Assessment and Plan / UC Course  I have reviewed the triage vital signs and the nursing notes.  Pertinent labs & imaging results that were available during my care of the patient were reviewed by me and considered in my medical decision making (see chart for details).     1.  Educated about COVID-19 virus infection: Patient was reassured that she has satisfied CDC's recommendations regarding isolation.  She is okay to travel and spend time with family for the holidays.  Patient was happy with the reassurance. Final Clinical Impressions(s) / UC Diagnoses   Final diagnoses:  Educated about COVID-19 virus infection     Discharge Instructions      You have satisfied the CDC guidelines for ending isolation. Please avoid covid testing for 3 months You can continue your routine activities If you have any further questions or concerns, please reach out to the urgent care   ED  Prescriptions   None    PDMP not reviewed  this encounter.   Merrilee Jansky, MD 01/17/21 [phone]

## 2021-02-06 ENCOUNTER — Other Ambulatory Visit: Payer: Self-pay | Admitting: Internal Medicine

## 2021-02-06 ENCOUNTER — Encounter: Payer: Self-pay | Admitting: Internal Medicine

## 2021-02-06 DIAGNOSIS — F5101 Primary insomnia: Secondary | ICD-10-CM

## 2021-02-06 MED ORDER — TRAZODONE HCL 150 MG PO TABS: 300.0000 mg | ORAL_TABLET | Freq: Every day | ORAL | 1 refills | Status: DC

## 2021-02-19 ENCOUNTER — Ambulatory Visit
Admission: RE | Admit: 2021-02-19 | Discharge: 2021-02-19 | Disposition: A | Discharge status: Home / Self Care | Payer: Medicare Other | Source: Ambulatory Visit | Attending: Internal Medicine | Admitting: Internal Medicine

## 2021-02-19 ENCOUNTER — Other Ambulatory Visit: Payer: Self-pay

## 2021-02-19 DIAGNOSIS — Z1231 Encounter for screening mammogram for malignant neoplasm of breast: Secondary | ICD-10-CM | POA: Diagnosis present

## 2021-03-06 ENCOUNTER — Other Ambulatory Visit: Payer: Self-pay

## 2021-03-06 ENCOUNTER — Telehealth: Payer: Self-pay

## 2021-03-06 ENCOUNTER — Ambulatory Visit (INDEPENDENT_AMBULATORY_CARE_PROVIDER_SITE_OTHER): Payer: Medicare Other

## 2021-03-06 DIAGNOSIS — Z0000 Encounter for general adult medical examination without abnormal findings: Secondary | ICD-10-CM | POA: Diagnosis not present

## 2021-03-06 DIAGNOSIS — H5319 Other subjective visual disturbances: Secondary | ICD-10-CM

## 2021-03-06 NOTE — Progress Notes (Signed)
Subjective:   Erin Yu is a 71 y.o. female who presents for Medicare Annual (Subsequent) preventive examination.  Virtual Visit via Telephone Note  I connected with  Briseida Chaudoin on 03/06/21 at 10:00 AM EST by telephone and verified that I am speaking with the correct person using two identifiers.  Location: Patient: home Provider: [facility] Persons participating in the virtual visit: patient/Nurse Health Advisor   I discussed the limitations, risks, security and privacy concerns of performing an evaluation and management service by telephone and the availability of in person appointments. The patient expressed understanding and agreed to proceed.  Interactive audio and video telecommunications were attempted between this nurse and patient, however failed, due to patient having technical difficulties OR patient did not have access to video capability.  We continued and completed visit with audio only.  Some vital signs may be absent or patient reported.   Reather Littler, LPN   Review of Systems     Cardiac Risk Factors include: advanced age (>40men, >49 women);dyslipidemia     Objective:    There were no vitals filed for this visit. There is no height or weight on file to calculate BMI.  Advanced Directives 03/06/2021 01/16/2021 11/07/2019  Does Patient Have a Medical Advance Directive? Yes No Yes  Type of Estate agent of [place];Living will - Healthcare Power of [place];Living will  Copy of Healthcare Power of Attorney in Chart? No - copy requested - No - copy requested    Current Medications (verified) Outpatient Encounter Medications as of 03/06/2021  Medication Sig   Ascorbic Acid (VITAMIN C) 1000 MG tablet Take 1,000 mg by mouth daily.   atorvastatin (LIPITOR) 40 MG tablet Take 1 tablet (40 mg total) by mouth daily.   Calcium Carbonate (CALCIUM 600 PO) Take 1 tablet by mouth daily at 6 (six) AM.   Cholecalciferol (VITAMIN D) 50 MCG (2000 UT) CAPS  Take 1 capsule by mouth daily at 2 PM.   Multiple Vitamins-Minerals (PRESERVISION AREDS 2+MULTI VIT PO) Take 1 tablet by mouth daily.   traZODone (DESYREL) 150 MG tablet Take 2 tablets (300 mg total) by mouth at bedtime.   diazepam (VALIUM) 5 MG tablet Take 5 mg by mouth as needed for anxiety. (Patient not taking: Reported on 03/06/2021)   No facility-administered encounter medications on file as of 03/06/2021.    Allergies (verified) Patient has no known allergies.   History: Past Medical History:  Diagnosis Date   Hyperlipidemia    Insomnia    Past Surgical History:  Procedure Laterality Date   ABDOMINAL HYSTERECTOMY     BREAST BIOPSY Bilateral    neg   BUNIONECTOMY     CATARACT EXTRACTION     CESAREAN SECTION     FACIAL COSMETIC SURGERY     PARS PLANA VITRECTOMY W/ REPAIR OF MACULAR HOLE     Family History  Problem Relation Age of Onset   Depression Mother    Hearing loss Mother    Leukemia Father    Heart disease Father    Hypertension Father    Hyperlipidemia Father    Diabetes Father    Breast cancer Neg Hx    Social History   Socioeconomic History   Marital status: Married    Spouse name: Not on file   Number of children: 1   Years of education: Not on file   Highest education level: Not on file  Occupational History   Not on file  Tobacco Use   Smoking status:  Never   Smokeless tobacco: Never  Vaping Use   Vaping Use: Never used  Substance and Sexual Activity   Alcohol use: Yes    Comment: social   Drug use: Never   Sexual activity: Not Currently  Other Topics Concern   Not on file  Social History Narrative   Not on file   Social Determinants of Health   Financial Resource Strain: Low Risk    Difficulty of Paying Living Expenses: Not hard at all  Food Insecurity: No Food Insecurity   Worried About Programme researcher, broadcasting/film/videounning Out of Food in the Last Year: Never true   Ran Out of Food in the Last Year: Never true  Transportation Needs: No Transportation Needs    Lack of Transportation (Medical): No   Lack of Transportation (Non-Medical): No  Physical Activity: Sufficiently Active   Days of Exercise per Week: 7 days   Minutes of Exercise per Session: 40 min  Stress: Stress Concern Present   Feeling of Stress : To some extent  Social Connections: Moderately Isolated   Frequency of Communication with Friends and Family: More than three times a week   Frequency of Social Gatherings with Friends and Family: Once a week   Attends Religious Services: Never   Database administratorActive Member of Clubs or Organizations: No   Attends Engineer, structuralClub or Organization Meetings: Never   Marital Status: Married    Tobacco Counseling Counseling given: Not Answered   Clinical Intake:  Pre-visit preparation completed: Yes  Pain : No/denies pain     Nutritional Risks: None Diabetes: No  How often do you need to have someone help you when you read instructions, pamphlets, or other written materials from your doctor or pharmacy?: 1 - Never    Interpreter Needed?: No  Information entered by :: Reather LittlerKasey Sriram Febles LPN   Activities of Daily Living In your present state of health, do you have any difficulty performing the following activities: 03/06/2021 02/28/2021  Hearing? N N  Vision? Y N  Difficulty concentrating or making decisions? N N  Walking or climbing stairs? N N  Dressing or bathing? N N  Doing errands, shopping? N N  Preparing Food and eating ? N N  Using the Toilet? N N  In the past six months, have you accidently leaked urine? Y Y  Do you have problems with loss of bowel control? N N  Managing your Medications? N N  Managing your Finances? N N  Housekeeping or managing your Housekeeping? N N  Some recent data might be hidden    Patient Care Team: Reubin MilanBerglund, Laura H, MD as PCP - General (Internal Medicine) Debbe OdeaAgbor-Etang, Brian, MD as PCP - Cardiology (Cardiology)  Indicate any recent Medical Services you may have received from other than [facility] providers in the past year  (date may be approximate).     Assessment:   This is a routine wellness examination for Erin Yu.  Hearing/Vision screen Hearing Screening - Comments:: Pt denies hearing difficulty Vision Screening - Comments:: [facility]   Dietary issues and exercise activities discussed: Current Exercise Habits: Home exercise routine, Type of exercise: walking, Time (Minutes): 40, Frequency (Times/Week): 7, Weekly Exercise (Minutes/Week): 280, Intensity: Moderate, Exercise limited by: None identified   Goals Addressed             This Visit's Progress    DIET - INCREASE WATER INTAKE       Recommend drinking 6-8 glasses of water per day  Depression Screen PHQ 2/9 Scores 03/06/2021 01/04/2021 10/19/2020 05/15/2020 03/13/2020 11/07/2019 08/23/2019  PHQ - 2 Score 0 0 2 0 0 0 0  PHQ- 9 Score - 2 5 0 0 - 1    Fall Risk Fall Risk  03/06/2021 02/28/2021 01/04/2021 10/19/2020 05/15/2020  Falls in the past year? 0 0 0 0 0  Number falls in past yr: 0 0 0 0 0  Injury with Fall? 0 0 0 0 0  Risk for fall due to : No Fall Risks - No Fall Risks History of fall(s) -  Follow up Falls prevention discussed - Falls evaluation completed Falls evaluation completed Falls evaluation completed    FALL RISK PREVENTION PERTAINING TO THE HOME:  Any stairs in or around the home? Yes  If so, are there any without handrails? No  Home free of loose throw rugs in walkways, pet beds, electrical cords, etc? Yes  Adequate lighting in your home to reduce risk of falls? Yes   ASSISTIVE DEVICES UTILIZED TO PREVENT FALLS:  Life alert? No  Use of a cane, walker or w/c? No  Grab bars in the bathroom? No  Shower chair or bench in shower? No  Elevated toilet seat or a handicapped toilet? Yes   TIMED UP AND GO:  Was the test performed? No . Telephonic visit.   Cognitive Function: Normal cognitive status assessed by direct observation by this Nurse Health Advisor. No abnormalities found.           Immunizations Immunization History  Administered Date(s) Administered   Fluad Quad(high Dose 65+) 10/18/2019, 10/19/2020   Influenza, High Dose Seasonal PF 10/30/2017   Influenza-Unspecified 10/08/2018   PFIZER(Purple Top)SARS-COV-2 Vaccination 03/09/2019, 03/30/2019, 11/19/2019   Pneumococcal Conjugate-13 08/23/2019   Pneumococcal Polysaccharide-23 10/30/2017   Tdap 08/17/2015   Zoster Recombinat (Shingrix) 08/30/2017, 12/27/2017    TDAP status: Up to date  Flu Vaccine status: Up to date  Pneumococcal vaccine status: Up to date  Covid-19 vaccine status: Completed vaccines  Qualifies for Shingles Vaccine? Yes   Zostavax completed No   Shingrix Completed?: Yes  Screening Tests Health Maintenance  Topic Date Due   COVID-19 Vaccine (4 - Booster for Pfizer series) 01/14/2020   MAMMOGRAM  02/20/2023   TETANUS/TDAP  08/16/2025   COLONOSCOPY (Pts 45-68yrs Insurance coverage will need to be confirmed)  04/04/2027   Pneumonia Vaccine 64+ Years old  Completed   INFLUENZA VACCINE  Completed   DEXA SCAN  Completed   Hepatitis C Screening  Completed   Zoster Vaccines- Shingrix  Completed   HPV VACCINES  Aged Out    Health Maintenance  Health Maintenance Due  Topic Date Due   COVID-19 Vaccine (4 - Booster for Pfizer series) 01/14/2020    Colorectal cancer screening: Type of screening: Colonoscopy. Completed 04/03/17. Repeat every 10 years  Mammogram status: Completed 02/19/21. Repeat every year  Bone Density status: Completed 02/08/20. Results reflect: Bone density results: OSTEOPENIA. Repeat every 2 years.  Lung Cancer Screening: (Low Dose CT Chest recommended if Age 68-80 years, 30 pack-year currently smoking OR have quit w/in 15years.) does not qualify.   Additional Screening:  Hepatitis C Screening: does qualify; Completed 10/19/20  Vision Screening: Recommended annual ophthalmology exams for early detection of glaucoma and other disorders of the eye. Is  the patient up to date with their annual eye exam?  Yes  Who is the provider or what is the name of the office in which the patient attends annual eye exams? [facility].  Dental Screening: Recommended annual dental exams for proper oral hygiene  Community Resource Referral / Chronic Care Management: CRR required this visit?  No   CCM required this visit?  No      Plan:     I have personally reviewed and noted the following in the patients chart:   Medical and social history Use of alcohol, tobacco or illicit drugs  Current medications and supplements including opioid prescriptions.  Functional ability and status Nutritional status Physical activity Advanced directives List of other physicians Hospitalizations, surgeries, and ER visits in previous 12 months Vitals Screenings to include cognitive, depression, and falls Referrals and appointments  In addition, I have reviewed and discussed with patient certain preventive protocols, quality metrics, and best practice recommendations. A written personalized care plan for preventive services as well as general preventive health recommendations were provided to patient.     Reather Littler, LPN   10/05/8336   Nurse Notes: pt c/o episode today approx 30 mins with floaters in vision as well as flashes. Pt states occasional blurriness. Pt has not experienced this in the last several months but she does have a hx of eye surgery including macular hole repair. Pt contacted [facility] to request appt; first availability in March but she plans to contact again to request emergency appt. Pt denies headaches or light sensitivity. Please advise if patient should be seen or referred anywhere else.   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 Pt advised to discuss with PCP.

## 2021-03-06 NOTE — Telephone Encounter (Signed)
Tried to call pt phone and left a VM about eye concerns.    I spoke with Dr Judithann Graves and she feels she needs to see an eye doctor this week for the flashes in your eyes along with vision issues.   Waiting for pt to let me know if she is willing to come to [facility] or [place] to see the eye doctor and I will place a STAT referral for them to call you to schedule this.   Also, Dr. 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 No further evaluation is needed and no concerns.  Sent patient a Clinical cytogeneticist message about this information and waiting for a call back or response.

## 2021-03-06 NOTE — Patient Instructions (Signed)
Erin Yu , Thank you for taking time to come for your Medicare Wellness Visit. I appreciate your ongoing commitment to your health goals. Please review the following plan we discussed and let me know if I can assist you in the future.   Screening recommendations/referrals: Colonoscopy: done 04/03/17. Repeat 03/2027 Mammogram: done 02/19/21 Bone Density: done 02/08/20 Recommended yearly ophthalmology/optometry visit for glaucoma screening and checkup Recommended yearly dental visit for hygiene and checkup  Vaccinations: Influenza vaccine: done 10/19/20 Pneumococcal vaccine: done 08/23/19 Tdap vaccine: done 08/17/15 Shingles vaccine: done 08/30/17 & 12/27/17   Covid-19:done 03/09/19, 03/30/19, 11/19/19  Advanced directives: Please bring a copy of your health care power of attorney and living will to the office at your convenience.   Conditions/risks identified: Keep up the great work!  Next appointment: Follow up in one year for your annual wellness visit    Preventive Care 65 Years and Older, Female Preventive care refers to lifestyle choices and visits with your health care provider that can promote health and wellness. What does preventive care include? A yearly physical exam. This is also called an annual well check. Dental exams once or twice a year. Routine eye exams. Ask your health care provider how often you should have your eyes checked. Personal lifestyle choices, including: Daily care of your teeth and gums. Regular physical activity. Eating a healthy diet. Avoiding tobacco and drug use. Limiting alcohol use. Practicing safe sex. Taking low-dose aspirin every day. Taking vitamin and mineral supplements as recommended by your health care provider. What happens during an annual well check? The services and screenings done by your health care provider during your annual well check will depend on your age, overall health, lifestyle risk factors, and family history of  disease. Counseling  Your health care provider may ask you questions about your: Alcohol use. Tobacco use. Drug use. Emotional well-being. Home and relationship well-being. Sexual activity. Eating habits. History of falls. Memory and ability to understand (cognition). Work and work Astronomer. Reproductive health. Screening  You may have the following tests or measurements: Height, weight, and BMI. Blood pressure. Lipid and cholesterol levels. These may be checked every 5 years, or more frequently if you are over 36 years old. Skin check. Lung cancer screening. You may have this screening every year starting at age 38 if you have a 30-pack-year history of smoking and currently smoke or have quit within the past 15 years. Fecal occult blood test (FOBT) of the stool. You may have this test every year starting at age 14. Flexible sigmoidoscopy or colonoscopy. You may have a sigmoidoscopy every 5 years or a colonoscopy every 10 years starting at age 49. Hepatitis C blood test. Hepatitis B blood test. Sexually transmitted disease (STD) testing. Diabetes screening. This is done by checking your blood sugar (glucose) after you have not eaten for a while (fasting). You may have this done every 1-3 years. Bone density scan. This is done to screen for osteoporosis. You may have this done starting at age 63. Mammogram. This may be done every 1-2 years. Talk to your health care provider about how often you should have regular mammograms. Talk with your health care provider about your test results, treatment options, and if necessary, the need for more tests. Vaccines  Your health care provider may recommend certain vaccines, such as: Influenza vaccine. This is recommended every year. Tetanus, diphtheria, and acellular pertussis (Tdap, Td) vaccine. You may need a Td booster every 10 years. Zoster vaccine. You may need this  after age 30. Pneumococcal 13-valent conjugate (PCV13) vaccine. One  dose is recommended after age 60. Pneumococcal polysaccharide (PPSV23) vaccine. One dose is recommended after age 61. Talk to your health care provider about which screenings and vaccines you need and how often you need them. This information is not intended to replace advice given to you by your health care provider. Make sure you discuss any questions you have with your health care provider. Document Released: 02/09/2015 Document Revised: 10/03/2015 Document Reviewed: 11/14/2014 Elsevier Interactive Patient Education  2017 [place] Prevention in the Home Falls can cause injuries. They can happen to people of all ages. There are many things you can do to make your home safe and to help prevent falls. What can I do on the outside of my home? Regularly fix the edges of walkways and driveways and fix any cracks. Remove anything that might make you trip as you walk through a door, such as a raised step or threshold. Trim any bushes or trees on the path to your home. Use bright outdoor lighting. Clear any walking paths of anything that might make someone trip, such as rocks or tools. Regularly check to see if handrails are loose or broken. Make sure that both sides of any steps have handrails. Any raised decks and porches should have guardrails on the edges. Have any leaves, snow, or ice cleared regularly. Use sand or salt on walking paths during winter. Clean up any spills in your garage right away. This includes oil or grease spills. What can I do in the bathroom? Use night lights. Install grab bars by the toilet and in the tub and shower. Do not use towel bars as grab bars. Use non-skid mats or decals in the tub or shower. If you need to sit down in the shower, use a plastic, non-slip stool. Keep the floor dry. Clean up any water that spills on the floor as soon as it happens. Remove soap buildup in the tub or shower regularly. Attach bath mats securely with double-sided  non-slip rug tape. Do not have throw rugs and other things on the floor that can make you trip. What can I do in the bedroom? Use night lights. Make sure that you have a light by your bed that is easy to reach. Do not use any sheets or blankets that are too big for your bed. They should not hang down onto the floor. Have a firm chair that has side arms. You can use this for support while you get dressed. Do not have throw rugs and other things on the floor that can make you trip. What can I do in the kitchen? Clean up any spills right away. Avoid walking on wet floors. Keep items that you use a lot in easy-to-reach places. If you need to reach something above you, use a strong step stool that has a grab bar. Keep electrical cords out of the way. Do not use floor polish or wax that makes floors slippery. If you must use wax, use non-skid floor wax. Do not have throw rugs and other things on the floor that can make you trip. What can I do with my stairs? Do not leave any items on the stairs. Make sure that there are handrails on both sides of the stairs and use them. Fix handrails that are broken or loose. Make sure that handrails are as long as the stairways. Check any carpeting to make sure that it is firmly attached to the  stairs. Fix any carpet that is loose or worn. Avoid having throw rugs at the top or bottom of the stairs. If you do have throw rugs, attach them to the floor with carpet tape. Make sure that you have a light switch at the top of the stairs and the bottom of the stairs. If you do not have them, ask someone to add them for you. What else can I do to help prevent falls? Wear shoes that: Do not have high heels. Have rubber bottoms. Are comfortable and fit you well. Are closed at the toe. Do not wear sandals. If you use a stepladder: Make sure that it is fully opened. Do not climb a closed stepladder. Make sure that both sides of the stepladder are locked into place. Ask  someone to hold it for you, if possible. Clearly mark and make sure that you can see: Any grab bars or handrails. First and last steps. Where the edge of each step is. Use tools that help you move around (mobility aids) if they are needed. These include: Canes. Walkers. Scooters. Crutches. Turn on the lights when you go into a dark area. Replace any light bulbs as soon as they burn out. Set up your furniture so you have a clear path. Avoid moving your furniture around. If any of your floors are uneven, fix them. If there are any pets around you, be aware of where they are. Review your medicines with your doctor. Some medicines can make you feel dizzy. This can increase your chance of falling. Ask your doctor what other things that you can do to help prevent falls. This information is not intended to replace advice given to you by your health care provider. Make sure you discuss any questions you have with your health care provider. Document Released: 11/09/2008 Document Revised: 06/21/2015 Document Reviewed: 02/17/2014 Elsevier Interactive Patient Education  2017 [employer].

## 2021-07-31 ENCOUNTER — Other Ambulatory Visit
Admission: RE | Admit: 2021-07-31 | Discharge: 2021-07-31 | Disposition: A | Discharge status: Home / Self Care | Payer: Medicare Other | Attending: Cardiology | Admitting: Cardiology

## 2021-07-31 ENCOUNTER — Other Ambulatory Visit: Payer: Self-pay

## 2021-07-31 DIAGNOSIS — E782 Mixed hyperlipidemia: Secondary | ICD-10-CM | POA: Insufficient documentation

## 2021-07-31 LAB — LIPID PANEL
Cholesterol: 199 mg/dL (ref 0–200)
HDL: 73 mg/dL (ref >40)
LDL Cholesterol: 118 mg/dL — ABNORMAL HIGH (ref 0–99)
Total CHOL/HDL Ratio: 2.7 RATIO
Triglycerides: 39 mg/dL (ref <150)
VLDL: 8 mg/dL (ref 0–40)

## 2021-08-02 ENCOUNTER — Telehealth: Payer: Self-pay

## 2021-08-02 DIAGNOSIS — Z79899 Other long term (current) drug therapy: Secondary | ICD-10-CM

## 2021-08-02 DIAGNOSIS — E782 Mixed hyperlipidemia: Secondary | ICD-10-CM

## 2021-08-02 NOTE — Telephone Encounter (Signed)
Called and left a detailed VM per DPR on file. Requested patient call back or MyChart reply with the pharmacy she would like [place] to use as the one on file says "inactive".

## 2021-08-02 NOTE — Telephone Encounter (Signed)
-----   Message from Debbe Odea, MD sent at 08/01/2021  4:22 PM EDT ----- Cholesterol improving compared to last values, but still not at goal.  Increase Lipitor to 80 mg daily.  Continue low-cholesterol heart healthy diet.

## 2021-08-02 NOTE — Telephone Encounter (Signed)
Patient called back and had a concern that in the past her PCP had to decrease her statin due to abnormal liver function results. She requested a Liver panel be drawn prior to increasing dose of Atorvastatin.  I ordered a liver panel as requested and will discuss with Dr. Azucena Cecil as requested.

## 2021-08-06 ENCOUNTER — Other Ambulatory Visit
Admission: RE | Admit: 2021-08-06 | Discharge: 2021-08-06 | Disposition: A | Discharge status: Home / Self Care | Payer: Medicare Other | Attending: Cardiology | Admitting: Cardiology

## 2021-08-06 DIAGNOSIS — E782 Mixed hyperlipidemia: Secondary | ICD-10-CM | POA: Insufficient documentation

## 2021-08-06 DIAGNOSIS — Z79899 Other long term (current) drug therapy: Secondary | ICD-10-CM | POA: Diagnosis present

## 2021-08-06 LAB — HEPATIC FUNCTION PANEL
ALT: 26 U/L (ref 0–44)
AST: 19 U/L (ref 15–41)
Albumin: 4.6 g/dL (ref 3.5–5.0)
Alkaline Phosphatase: 39 U/L (ref 38–126)
Bilirubin, Direct: <0.1 mg/dL (ref 0.0–0.2)
Total Bilirubin: 0.9 mg/dL (ref 0.3–1.2)
Total Protein: 7.6 g/dL (ref 6.5–8.1)

## 2021-08-08 ENCOUNTER — Encounter: Payer: Self-pay | Admitting: Cardiology

## 2021-08-08 ENCOUNTER — Ambulatory Visit: Payer: Self-pay | Admitting: *Deleted

## 2021-08-08 ENCOUNTER — Ambulatory Visit (INDEPENDENT_AMBULATORY_CARE_PROVIDER_SITE_OTHER): Payer: Medicare Other | Admitting: Cardiology

## 2021-08-08 VITALS — BP 110/76 | HR 80 | Ht <= 58 in | Wt 124.0 lb

## 2021-08-08 DIAGNOSIS — E7800 Pure hypercholesterolemia, unspecified: Secondary | ICD-10-CM

## 2021-08-08 DIAGNOSIS — R0602 Shortness of breath: Secondary | ICD-10-CM | POA: Diagnosis not present

## 2021-08-08 MED ORDER — ATORVASTATIN CALCIUM 80 MG PO TABS: 80.0000 mg | ORAL_TABLET | Freq: Every day | ORAL | 5 refills | Status: DC

## 2021-08-08 MED ORDER — ATORVASTATIN CALCIUM 80 MG PO TABS: 80.0000 mg | ORAL_TABLET | Freq: Every day | ORAL | 1 refills | Status: DC

## 2021-08-08 NOTE — Patient Instructions (Signed)
Medication Instructions:   Your physician has recommended you make the following change in your medication:    INCREASE your Atorvastatin to 80 MG once a day.   *If you need a refill on your cardiac medications before your next appointment, please call your pharmacy*   Lab Work:  Your physician recommends that you return for a FASTING lipid profile and Liver panel: IN 3 MONTHS  - You will need to be fasting. Please do not have anything to eat or drink after midnight the morning you have the lab work. You may only have water or black coffee with no cream or sugar.   - Please go to the [facility]. You will check in at the front desk to the right as you walk into the atrium. Valet Parking is offered if needed. - No appointment needed. You may go any day between 7 am and 6 pm.    Follow-Up: At [facility], you and your health needs are our priority.  As part of our continuing mission to provide you with exceptional heart care, we have created designated Provider Care Teams.  These Care Teams include your primary Cardiologist (physician) and Advanced Practice Providers (APPs -  Physician Assistants and Nurse Practitioners) who all work together to provide you with the care you need, when you need it.  We recommend signing up for the patient portal called "MyChart".  Sign up information is provided on this After Visit Summary.  MyChart is used to connect with patients for Virtual Visits (Telemedicine).  Patients are able to view lab/test results, encounter notes, upcoming appointments, etc.  Non-urgent messages can be sent to your provider as well.   To learn more about what you can do with MyChart, go to [email or web address].    Your next appointment:   4-5 month(s)  The format for your next appointment:   In Person  Provider:   You may see Debbe Odea, MD or one of the following Advanced Practice Providers on your designated Care Team:   Nicolasa Ducking, NP Eula Listen, PA-C Cadence Fransico Michael, [place]    Other Instructions   Important Information About Sugar

## 2021-08-08 NOTE — Progress Notes (Signed)
Cardiology Office Note:    Date:  08/08/2021   ID:  Erin Yu, DOB 05-13-1950, MRN [id number]  PCP:  Erin Milan, MD   [facility] Providers Cardiologist:  Debbe Odea, MD     Referring MD: Erin Milan, MD   Chief Complaint  Patient presents with   Follow-up    6 month F/U-Patient states she is still feeling SOB.     History of Present Illness:    Erin Yu is a 71 y.o. female with a hx of hyperlipidemia who presents for follow-up.    Being seen for hyperlipidemia and medication management.  Previously started on Lipitor 40 mg daily, repeat fasting lipid profile obtained last week showing improvement in cholesterol levels although still elevated.  Endorsed taking Lipitor 40 mg daily as prescribed.  Still has nonspecific shortness of breath.  Previous work-up with echo and coronary CTA were unrevealing.  Denies chest pain.  Otherwise doing okay.  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   Calcium Carbonate (CALCIUM 600 PO) Take 1 tablet by mouth daily at 6 (six) AM.   Cholecalciferol (VITAMIN D) 50 MCG (2000 UT) CAPS Take 1 capsule by mouth daily at 2 PM.   diazepam (VALIUM) 5 MG tablet Take 5 mg by mouth as needed for anxiety.   Multiple Vitamins-Minerals (PRESERVISION AREDS 2+MULTI VIT PO) Take 1 tablet by mouth daily.   traZODone (DESYREL) 150 MG tablet Take 2 tablets (300 mg total) by mouth at bedtime.   [DISCONTINUED] atorvastatin (LIPITOR) 40  MG tablet Take 1 tablet (40 mg total) by mouth daily.     Allergies:   Patient has no known allergies.   Social History   Socioeconomic History   Marital status: Married    Spouse name: Not on file   Number of children: 1   Years of education: Not on file   Highest education level: Not on file  Occupational History   Not on file  Tobacco Use   Smoking status: Never   Smokeless tobacco: Never  Vaping Use   Vaping Use: Never used  Substance and Sexual Activity   Alcohol use: Yes    Comment: social   Drug use: Never   Sexual activity: Not Currently  Other Topics Concern   Not on file  Social History Narrative   Not on file   Social Determinants of Health   Financial Resource Strain: Low Risk  (03/06/2021)   Overall Financial Resource Strain (CARDIA)    Difficulty of Paying Living Expenses: Not hard at all  Food Insecurity: No Food Insecurity (03/06/2021)   Hunger Vital Sign    Worried About Running Out of Food in the Last Year: Never true    Ran Out of Food in the Last Year: Never true  Transportation Needs: No Transportation Needs (03/06/2021)   PRAPARE - Transportation  Lack of Transportation (Medical): No    Lack of Transportation (Non-Medical): No  Physical Activity: Sufficiently Active (03/06/2021)   Exercise Vital Sign    Days of Exercise per Week: 7 days    Minutes of Exercise per Session: 40 min  Stress: Stress Concern Present (03/06/2021)   [employer] of Occupational Health - Occupational Stress Questionnaire    Feeling of Stress : To some extent  Social Connections: Moderately Isolated (03/06/2021)   Social Connection and Isolation Panel [NHANES]    Frequency of Communication with Friends and Family: More than three times a week    Frequency of Social Gatherings with Friends and Family: Once a week    Attends Religious Services: Never    Database administrator or Organizations: No    Attends Engineer, structural: Never    Marital Status: Married      Family History: [name] family history includes Depression in her mother; Diabetes in her father; Hearing loss in her mother; Heart disease in her father; Hyperlipidemia in her father; Hypertension in her father; Leukemia in her father. There is no history of Breast cancer.  ROS:   Please see the history of present illness.     All other systems reviewed and are negative.  EKGs/Labs/Other Studies Reviewed:    The following studies were reviewed today:   EKG:  EKG not ordered today.   Recent Labs: 10/19/2020: Hemoglobin 14.7; Platelets 247; TSH 1.340 11/21/2020: BUN 19; Creatinine, Ser 0.66; Potassium 5.1; Sodium 141 08/06/2021: ALT 26  Recent Lipid Panel    Component Value Date/Time   CHOL 199 07/31/2021 0954   CHOL 208 (H) 10/19/2020 1048   TRIG 39 07/31/2021 0954   HDL 73 07/31/2021 0954   HDL 75 10/19/2020 1048   CHOLHDL 2.7 07/31/2021 0954   VLDL 8 07/31/2021 0954   LDLCALC 118 (H) 07/31/2021 0954   LDLCALC 119 (H) 10/19/2020 1048     Risk Assessment/Calculations:          Physical Exam:    VS:  BP 110/76 (BP Location: Left Arm, Patient Position: Sitting, Cuff Size: Normal)   Pulse 80   Ht 4\' 10"  (1.473 m)   Wt 124 lb (56.2 kg)   SpO2 97%   BMI 25.92 kg/m     Wt Readings from Last 3 Encounters:  08/08/21 124 lb (56.2 kg)  01/16/21 121 lb 7.7 oz (55.1 kg)  01/04/21 122 lb (55.3 kg)     GEN:  Well nourished, well developed in no acute distress HEENT: Normal NECK: No JVD; No carotid bruits CARDIAC: RRR, no murmurs, rubs, gallops RESPIRATORY:  Clear to auscultation without rales, wheezing or rhonchi  ABDOMEN: Soft, non-tender, non-distended MUSCULOSKELETAL:  No edema; No deformity  SKIN: Warm and dry NEUROLOGIC:  Alert and oriented x 3 PSYCHIATRIC:  Normal affect   ASSESSMENT:    1. Pure hypercholesterolemia   2. Shortness of breath     PLAN:    In order of problems listed above:  Hyperlipidemia, LDL improved but not controlled.   Increase Lipitor to 80 mg daily.  Repeat lipid panel in 3 months. Specific shortness of breath, previous work-up with echo and coronary CTA was unrevealing.  Follow-up with PCP regarding additional testing/work-up for noncardiac etiologies.  Follow-up in 4 months.   Medication Adjustments/Labs and Tests Ordered: Current medicines are reviewed at length with the patient today.  Concerns regarding medicines are outlined above.  Orders Placed This Encounter  Procedures   Lipid panel  Hepatic function panel   EKG 12-Lead     Meds ordered this encounter  Medications   DISCONTD: atorvastatin (LIPITOR) 80 MG tablet    Sig: Take 1 tablet (80 mg total) by mouth daily.    Dispense:  30 tablet    Refill:  5   atorvastatin (LIPITOR) 80 MG tablet    Sig: Take 1 tablet (80 mg total) by mouth daily.    Dispense:  90 tablet    Refill:  1      Patient Instructions  Medication Instructions:   Your physician has recommended you make the following change in your medication:    INCREASE your Atorvastatin to 80 MG once a day.   *If you need a refill on your cardiac medications before your next appointment, please call your pharmacy*   Lab Work:  Your physician recommends that you return for a FASTING lipid profile and Liver panel: IN 3 MONTHS  - You will need to be fasting. Please do not have anything to eat or drink after midnight the morning you have the lab work. You may only have water or black coffee with no cream or sugar.   - Please go to the [facility]. You will check in at the front desk to the right as you walk into the atrium. Valet Parking is offered if needed. - No appointment needed. You may go any day between 7 am and 6 pm.    Follow-Up: At [facility], you and your health needs are our priority.  As part of our continuing mission to provide you with exceptional heart care, we have created designated Provider Care Teams.  These Care Teams include your primary  Cardiologist (physician) and Advanced Practice Providers (APPs -  Physician Assistants and Nurse Practitioners) who all work together to provide you with the care you need, when you need it.  We recommend signing up for the patient portal called "MyChart".  Sign up information is provided on this After Visit Summary.  MyChart is used to connect with patients for Virtual Visits (Telemedicine).  Patients are able to view lab/test results, encounter notes, upcoming appointments, etc.  Non-urgent messages can be sent to your provider as well.   To learn more about what you can do with MyChart, go to [email or web address].    Your next appointment:   4-5 month(s)  The format for your next appointment:   In Person  Provider:   You may see Debbe Odea, MD or one of the following Advanced Practice Providers on your designated Care Team:   Nicolasa Ducking, NP Eula Listen, PA-C Cadence Fransico Michael, [place]    Other Instructions   Important Information About Sugar         Signed, Debbe Odea, MD  08/08/2021 12:09 PM    [facility]

## 2021-08-08 NOTE — Telephone Encounter (Signed)
  Chief Complaint: SOB Symptoms: SOB with exertion only. Frequency: 8 months, not worsening   States had cardiology F/U as directed and was told not cardiac related, to refer back to PCP.: Patient denies  wheezing, CP, edema Disposition: [] ED /[] Urgent Care (no appt availability in office) / [] Appointment(In office/virtual)/ []  [place] [facility]/ [] Home Care/ [] Refused Recommended Disposition /[] [place] [facility]/ [x]  Follow-up with PCP Additional Notes: Next available appt out of protocol timeframe. Pt prefers to see Dr. .  Please advise. Advised ED for worsening symptoms.  SOBReason for Disposition  [1] MILD longstanding difficulty breathing AND [2]  SAME as normal  Answer Assessment - Initial Assessment Questions 1. RESPIRATORY STATUS: "Describe your breathing?" (e.g., wheezing, shortness of breath, unable to speak, severe coughing)      With exertion 2. ONSET: "When did this breathing problem begin?"      8 months 3. PATTERN "Does the difficult breathing come and go, or has it been constant since it started?"      On exertion 4. SEVERITY: "How bad is your breathing?" (e.g., mild, moderate, severe)    - MILD: No SOB at rest, mild SOB with walking, speaks normally in sentences, can lie down, no retractions, pulse < 100.    - MODERATE: SOB at rest, SOB with minimal exertion and prefers to sit, cannot lie down flat, speaks in phrases, mild retractions, audible wheezing, pulse 100-120.    - SEVERE: Very SOB at rest, speaks in single words, struggling to breathe, sitting hunched forward, retractions, pulse > 120      moderate 5. RECURRENT SYMPTOM: "Have you had difficulty breathing before?" If Yes, ask: "When was the last time?" and "What happened that time?"      Yes for 8 months 6. CARDIAC HISTORY: "Do you have any history of heart disease?" (e.g., heart attack, angina, bypass surgery, angioplasty)       7. LUNG HISTORY: "Do you have any history of lung disease?"   (e.g., pulmonary embolus, asthma, emphysema)      8. CAUSE: "What do you think is causing the breathing problem?"      Unsure 9. OTHER SYMPTOMS: "Do you have any other symptoms? (e.g., dizziness, runny nose, cough, chest pain, fever)     No 10. O2 SATURATION MONITOR:  "Do you use an oxygen saturation monitor (pulse oximeter) at home?" If Yes, "What is your reading (oxygen level) today?" "What is your usual oxygen saturation reading?" (e.g., 95%)       Yes but haven't checked lately  Protocols used: Breathing Difficulty-A-AH

## 2021-08-12 ENCOUNTER — Ambulatory Visit: Payer: Self-pay | Admitting: *Deleted

## 2021-08-12 ENCOUNTER — Ambulatory Visit
Admission: RE | Admit: 2021-08-12 | Discharge: 2021-08-12 | Disposition: A | Discharge status: Home / Self Care | Payer: Medicare Other | Source: Ambulatory Visit | Attending: Internal Medicine | Admitting: Internal Medicine

## 2021-08-12 ENCOUNTER — Ambulatory Visit (INDEPENDENT_AMBULATORY_CARE_PROVIDER_SITE_OTHER): Payer: Medicare Other | Admitting: Internal Medicine

## 2021-08-12 ENCOUNTER — Encounter: Payer: Self-pay | Admitting: Internal Medicine

## 2021-08-12 ENCOUNTER — Ambulatory Visit
Admission: RE | Admit: 2021-08-12 | Discharge: 2021-08-12 | Disposition: A | Discharge status: Home / Self Care | Payer: Medicare Other | Attending: Internal Medicine | Admitting: Internal Medicine

## 2021-08-12 VITALS — BP 122/84 | HR 100 | Ht <= 58 in | Wt 126.0 lb

## 2021-08-12 DIAGNOSIS — M180 Bilateral primary osteoarthritis of first carpometacarpal joints: Secondary | ICD-10-CM | POA: Diagnosis not present

## 2021-08-12 DIAGNOSIS — R0602 Shortness of breath: Secondary | ICD-10-CM

## 2021-08-12 DIAGNOSIS — R5383 Other fatigue: Secondary | ICD-10-CM | POA: Diagnosis not present

## 2021-08-12 DIAGNOSIS — F5101 Primary insomnia: Secondary | ICD-10-CM

## 2021-08-12 MED ORDER — TRAZODONE HCL 150 MG PO TABS: 300.0000 mg | ORAL_TABLET | Freq: Every day | ORAL | 1 refills | Status: AC

## 2021-08-12 NOTE — Telephone Encounter (Signed)
Able to reach pt, will come in at 420 today.

## 2021-08-12 NOTE — Patient Instructions (Signed)
Voltaren gel - apply 3-4 times per day to painful thumbs

## 2021-08-12 NOTE — Telephone Encounter (Signed)
  Chief Complaint: sob Symptoms: sob with activity Frequency: 8 months Pertinent Negatives: Patient denies needing UC/ED Disposition: [] ED /[] Urgent Care (no appt availability in office) / [x] Appointment(In office/virtual)/ []  [place] [facility]/ [] Home Care/ [] Refused Recommended Disposition /[] [place] [facility]/ []  Follow-up with PCP Additional Notes: Appt made for pt for Sept 1, also on wait list. She is quite upset that she did not hear from office Friday. There is a note that a voicemail was left, she states it is not possible. I suggested maybe there was a mishap when typing in the number. Pt rather upset that the practice is book so far out and is unable to accommodate patients.

## 2021-08-12 NOTE — Progress Notes (Signed)
Date:  08/12/2021   Name:  Erin Yu   DOB:  1950-09-05   MRN:  [id number]   Chief Complaint: Shortness of Breath  Shortness of Breath This is a chronic problem. Episode onset: for the past year - then had Covid in Jan. The problem occurs daily. The problem has been unchanged. The average episode lasts -2 minutes. Pertinent negatives include no chest pain, ear pain, fever, headaches, leg pain, leg swelling, sputum production or wheezing. The symptoms are aggravated by exercise. The patient has no known risk factors for DVT/PE. She has tried nothing (Had Cardiac evaluation which did not reveal a cause) for the symptoms.  Hand Pain  There was no injury mechanism. The pain is present in the left fingers. The quality of the pain is described as aching. The pain does not radiate. The pain is mild. The pain has been Fluctuating since the incident. Pertinent negatives include no chest pain.    Lab Results  Component Value Date   NA 141 11/21/2020   K 5.1 11/21/2020   CO2 24 11/21/2020   GLUCOSE 93 11/21/2020   BUN 19 11/21/2020   CREATININE 0.66 11/21/2020   CALCIUM 10.2 11/21/2020   EGFR 95 11/21/2020   GFRNONAA 92 03/13/2020   Lab Results  Component Value Date   CHOL 199 07/31/2021   HDL 73 07/31/2021   LDLCALC 118 (H) 07/31/2021   TRIG 39 07/31/2021   CHOLHDL 2.7 07/31/2021   Lab Results  Component Value Date   TSH 1.340 10/19/2020   Lab Results  Component Value Date   HGBA1C 5.6 10/19/2020   Lab Results  Component Value Date   WBC 5.0 10/19/2020   HGB 14.7 10/19/2020   HCT 43.7 10/19/2020   MCV 91 10/19/2020   PLT 247 10/19/2020   Lab Results  Component Value Date   ALT 26 08/06/2021   AST 19 08/06/2021   ALKPHOS 39 08/06/2021   BILITOT 0.9 08/06/2021   Lab Results  Component Value Date   VD25OH 74.6 10/19/2020     Review of Systems  Constitutional:  Negative for fever.  HENT:  Negative for ear pain.   Eyes:  Negative for visual disturbance.   Respiratory:  Positive for shortness of breath. Negative for sputum production and wheezing.   Cardiovascular:  Negative for chest pain and leg swelling.  Musculoskeletal:  Positive for arthralgias (both thumbs).  Neurological:  Negative for dizziness, light-headedness and headaches.  Psychiatric/Behavioral:  Negative for dysphoric mood and sleep disturbance. The patient is not nervous/anxious.     Patient Active Problem List   Diagnosis Date Noted   Right bundle branch block (RBBB) 05/15/2020   Osteopenia determined by x-ray 02/08/2020   Elevated glucose 10/19/2019   Primary insomnia 10/18/2019   HNP (herniated nucleus pulposus), lumbar 08/09/2019   Primary osteoarthritis of both first carpometacarpal joints 08/09/2019   OAB (overactive bladder) 08/09/2019   Mixed hyperlipidemia 08/09/2019   Macular hole of right eye 08/22/2017   Pseudophakia of both eyes 08/22/2017    No Known Allergies  Past Surgical History:  Procedure Laterality Date   ABDOMINAL HYSTERECTOMY     BREAST BIOPSY Bilateral    neg   BUNIONECTOMY     CATARACT EXTRACTION     CESAREAN SECTION     FACIAL COSMETIC SURGERY     PARS PLANA VITRECTOMY W/ REPAIR OF MACULAR HOLE      Social History   Tobacco Use   Smoking status: Never   Smokeless  tobacco: Never  Vaping Use   Vaping Use: Never used  Substance Use Topics   Alcohol use: Yes    Comment: social   Drug use: Never     Medication list has been reviewed and updated.  Current Meds  Medication Sig   Ascorbic Acid (VITAMIN C) 1000 MG tablet Take 1,000 mg by mouth daily.   atorvastatin (LIPITOR) 80 MG tablet Take 1 tablet (80 mg total) by mouth daily.   Cholecalciferol (VITAMIN D) 50 MCG (2000 UT) CAPS Take 1 capsule by mouth daily at 2 PM.   diazepam (VALIUM) 5 MG tablet Take 5 mg by mouth as needed for anxiety.   Multiple Vitamins-Minerals (PRESERVISION AREDS 2+MULTI VIT PO) Take 1 tablet by mouth daily.   traZODone (DESYREL) 150 MG tablet Take  2 tablets (300 mg total) by mouth at bedtime.       08/12/2021    4:14 PM 01/04/2021    1:41 PM 10/19/2020    9:58 AM 05/15/2020    9:30 AM  GAD 7 : Generalized Anxiety Score  Nervous, Anxious, on Edge '1 1 1 ' 0  Control/stop worrying '1 1 3 ' 0  Worry too much - different things '1 1 3 ' 0  Trouble relaxing 1 0 0 0  Restless 0 0 0 0  Easily annoyed or irritable 0 0 2 0  Afraid - awful might happen 0 0 0 0  Total GAD 7 Score '4 3 9 ' 0  Anxiety Difficulty Not difficult at all Not difficult at all Not difficult at all Not difficult at all       08/12/2021    4:14 PM 03/06/2021   10:17 AM 01/04/2021    1:41 PM  Depression screen PHQ 2/9  Decreased Interest 0 0 0  Down, Depressed, Hopeless 0 0 0  PHQ - 2 Score 0 0 0  Altered sleeping 2  2  Tired, decreased energy 2  0  Change in appetite 0  0  Feeling bad or failure about yourself  0  0  Trouble concentrating 0  0  Moving slowly or fidgety/restless 0  0  Suicidal thoughts 0  0  PHQ-9 Score 4  2  Difficult doing work/chores Not difficult at all  Not difficult at all    BP Readings from Last 3 Encounters:  08/12/21 122/84  08/08/21 110/76  01/16/21 (!) 146/70    Physical Exam Vitals and nursing note reviewed.  Constitutional:      General: She is not in acute distress.    Appearance: She is well-developed.  HENT:     Head: Normocephalic and atraumatic.  Pulmonary:     Effort: Pulmonary effort is normal. No respiratory distress.     Breath sounds: No decreased breath sounds, wheezing or rhonchi.  Abdominal:     Palpations: Abdomen is soft.  Musculoskeletal:     Right hand: Bony tenderness (first MCP joints) present.     Left hand: Bony tenderness present.     Right lower leg: No tenderness. No edema.     Left lower leg: No tenderness. No edema.  Skin:    General: Skin is warm and dry.     Capillary Refill: Capillary refill takes less than 2 seconds.     Findings: No rash.  Neurological:     Mental Status: She is alert and  oriented to person, place, and time.  Psychiatric:        Mood and Affect: Mood normal.  Behavior: Behavior normal.     Wt Readings from Last 3 Encounters:  08/12/21 126 lb (57.2 kg)  08/08/21 124 lb (56.2 kg)  01/16/21 121 lb 7.7 oz (55.1 kg)    BP 122/84   Pulse 100   Ht '4\' 10"'  (1.473 m)   Wt 126 lb (57.2 kg)   SpO2 97%   BMI 26.33 kg/m   Assessment and Plan: 1. Shortness of breath Of uncertain cause - will get CBC to rule out anemia CXR  Trial of Breztri - one puff bid - CBC with Differential/Platelet - DG Chest 2 View  2. Fatigue, unspecified type May be unrelated to shortness of breath Cardiac work up negative - TSH + free T4 - Basic metabolic panel  3. Primary insomnia - traZODone (DESYREL) 150 MG tablet; Take 2 tablets (300 mg total) by mouth at bedtime.  Dispense: 180 tablet; Refill: 1  4. Primary osteoarthritis of both first carpometacarpal joints Recommend Voltaren gel qid   Partially dictated using Editor, commissioning. Any errors are unintentional.  Halina Maidens, MD [facility]  08/12/2021

## 2021-08-12 NOTE — Telephone Encounter (Signed)
Called pt left VM to call back.  KP 

## 2021-08-12 NOTE — Telephone Encounter (Signed)
Called pt x2. Phone going straight to VM.  KP

## 2021-08-12 NOTE — Telephone Encounter (Signed)
Sent pt a [employer] as well.  KP

## 2021-08-13 LAB — CBC WITH DIFFERENTIAL/PLATELET
Basophils Absolute: 0.1 10*3/uL (ref 0.0–0.2)
Basos: 1 %
EOS (ABSOLUTE): 0.3 10*3/uL (ref 0.0–0.4)
Eos: 5 %
Hematocrit: 43.6 % (ref 34.0–46.6)
Hemoglobin: 14.4 g/dL (ref 11.1–15.9)
Immature Grans (Abs): 0 10*3/uL (ref 0.0–0.1)
Immature Granulocytes: 0 %
Lymphocytes Absolute: 2.2 10*3/uL (ref 0.7–3.1)
Lymphs: 31 %
MCH: 30.6 pg (ref 26.6–33.0)
MCHC: 33 g/dL (ref 31.5–35.7)
MCV: 93 fL (ref 79–97)
Monocytes Absolute: 0.6 10*3/uL (ref 0.1–0.9)
Monocytes: 9 %
Neutrophils Absolute: 3.7 10*3/uL (ref 1.4–7.0)
Neutrophils: 54 %
Platelets: 290 10*3/uL (ref 150–450)
RBC: 4.71 x10E6/uL (ref 3.77–5.28)
RDW: 12.2 % (ref 11.7–15.4)
WBC: 6.9 10*3/uL (ref 3.4–10.8)

## 2021-08-13 LAB — BASIC METABOLIC PANEL
BUN/Creatinine Ratio: 30 — ABNORMAL HIGH (ref 12–28)
BUN: 20 mg/dL (ref 8–27)
CO2: 20 mmol/L (ref 20–29)
Calcium: 9.6 mg/dL (ref 8.7–10.3)
Chloride: 104 mmol/L (ref 96–106)
Creatinine, Ser: 0.67 mg/dL (ref 0.57–1.00)
Glucose: 101 mg/dL — ABNORMAL HIGH (ref 70–99)
Potassium: 4 mmol/L (ref 3.5–5.2)
Sodium: 140 mmol/L (ref 134–144)
eGFR: 95 mL/min/{1.73_m2} (ref >59)

## 2021-08-13 LAB — TSH+FREE T4
Free T4: 1.27 ng/dL (ref 0.82–1.77)
TSH: 1.99 u[IU]/mL (ref 0.450–4.500)

## 2021-09-27 ENCOUNTER — Ambulatory Visit: Payer: Medicare Other | Admitting: Internal Medicine

## 2021-10-21 ENCOUNTER — Ambulatory Visit (INDEPENDENT_AMBULATORY_CARE_PROVIDER_SITE_OTHER): Payer: Medicare Other | Admitting: Internal Medicine

## 2021-10-21 ENCOUNTER — Encounter: Payer: Self-pay | Admitting: Internal Medicine

## 2021-10-21 VITALS — BP 118/68 | HR 77 | Ht <= 58 in | Wt 124.0 lb

## 2021-10-21 DIAGNOSIS — R197 Diarrhea, unspecified: Secondary | ICD-10-CM

## 2021-10-21 DIAGNOSIS — F5101 Primary insomnia: Secondary | ICD-10-CM | POA: Diagnosis not present

## 2021-10-21 DIAGNOSIS — Z23 Encounter for immunization: Secondary | ICD-10-CM | POA: Diagnosis not present

## 2021-10-21 DIAGNOSIS — R7309 Other abnormal glucose: Secondary | ICD-10-CM | POA: Diagnosis not present

## 2021-10-21 DIAGNOSIS — E782 Mixed hyperlipidemia: Secondary | ICD-10-CM | POA: Diagnosis not present

## 2021-10-21 DIAGNOSIS — Z1231 Encounter for screening mammogram for malignant neoplasm of breast: Secondary | ICD-10-CM | POA: Diagnosis not present

## 2021-10-21 MED ORDER — DIAZEPAM 5 MG PO TABS: 5.0000 mg | ORAL_TABLET | ORAL | 0 refills | Status: DC | PRN

## 2021-10-21 MED ORDER — DIAZEPAM 5 MG PO TABS: 5.0000 mg | ORAL_TABLET | ORAL | 0 refills | Status: AC | PRN

## 2021-10-21 NOTE — Progress Notes (Signed)
Date:  10/21/2021   Name:  Erin Yu   DOB:  04-09-1950   MRN:  [id number]   Chief Complaint: Annual Exam Erin Yu is a 71 y.o. female who presents today for her Complete Annual Exam. She feels well. She reports exercising. She reports she is sleeping well. Breast complaints - none.  She almost ready to move to her new home in [place].  Mammogram: 01/2021 DEXA: 01/2020 osteopenia Pap smear: discontinued Colonoscopy: 03/2017  Health Maintenance Due  Topic Date Due   COVID-19 Vaccine (4 - Pfizer risk series) 01/14/2020   INFLUENZA VACCINE  08/27/2021    Immunization History  Administered Date(s) Administered   Fluad Quad(high Dose 65+) 10/18/2019, 10/19/2020   Influenza, High Dose Seasonal PF 10/30/2017   Influenza-Unspecified 10/08/2018   PFIZER(Purple Top)SARS-COV-2 Vaccination 03/09/2019, 03/30/2019, 11/19/2019   Pneumococcal Conjugate-13 08/23/2019   Pneumococcal Polysaccharide-23 10/30/2017   Tdap 08/17/2015   Zoster Recombinat (Shingrix) 08/30/2017, 12/27/2017    Hyperlipidemia This is a chronic problem. The problem is controlled. Pertinent negatives include no chest pain or shortness of breath. Current antihyperlipidemic treatment includes statins. The current treatment provides significant improvement of lipids.  Insomnia Primary symptoms: sleep disturbance.   The problem occurs nightly. The problem is unchanged.  Diarrhea  This is a recurrent problem. The current episode started more than 1 month ago. Episode frequency: about every 7 days she has a day of recurrent watery stools. The stool consistency is described as Watery. The patient states that diarrhea does not awaken her from sleep. Associated symptoms include arthralgias. Pertinent negatives include no abdominal pain, chills, coughing, fever, headaches, vomiting or weight loss. Nothing aggravates the symptoms. There are no known risk factors. She has tried nothing for the symptoms.    Lab Results   Component Value Date   NA 140 08/12/2021   K 4.0 08/12/2021   CO2 20 08/12/2021   GLUCOSE 101 (H) 08/12/2021   BUN 20 08/12/2021   CREATININE 0.67 08/12/2021   CALCIUM 9.6 08/12/2021   EGFR 95 08/12/2021   GFRNONAA 92 03/13/2020   Lab Results  Component Value Date   CHOL 199 07/31/2021   HDL 73 07/31/2021   LDLCALC 118 (H) 07/31/2021   TRIG 39 07/31/2021   CHOLHDL 2.7 07/31/2021   Lab Results  Component Value Date   TSH 1.990 08/12/2021   Lab Results  Component Value Date   HGBA1C 5.6 10/19/2020   Lab Results  Component Value Date   WBC 6.9 08/12/2021   HGB 14.4 08/12/2021   HCT 43.6 08/12/2021   MCV 93 08/12/2021   PLT 290 08/12/2021   Lab Results  Component Value Date   ALT 26 08/06/2021   AST 19 08/06/2021   ALKPHOS 39 08/06/2021   BILITOT 0.9 08/06/2021   Lab Results  Component Value Date   VD25OH 74.6 10/19/2020     Review of Systems  Constitutional:  Negative for chills, fatigue, fever and weight loss.  HENT:  Negative for congestion, hearing loss, tinnitus, trouble swallowing and voice change.   Eyes:  Negative for visual disturbance.  Respiratory:  Negative for cough, chest tightness, shortness of breath and wheezing.   Cardiovascular:  Negative for chest pain, palpitations and leg swelling.  Gastrointestinal:  Positive for diarrhea. Negative for abdominal pain, constipation and vomiting.  Endocrine: Negative for polydipsia and polyuria.  Genitourinary:  Negative for dysuria, frequency, genital sores, vaginal bleeding and vaginal discharge.  Musculoskeletal:  Positive for arthralgias. Negative for gait problem and joint  swelling.  Skin:  Negative for color change and rash.  Neurological:  Negative for dizziness, tremors, light-headedness and headaches.  Hematological:  Negative for adenopathy. Does not bruise/bleed easily.  Psychiatric/Behavioral:  Positive for sleep disturbance. Negative for dysphoric mood. The patient is nervous/anxious and has  insomnia.     Patient Active Problem List   Diagnosis Date Noted   Right bundle branch block (RBBB) 05/15/2020   Osteopenia determined by x-ray 02/08/2020   Elevated glucose 10/19/2019   Primary insomnia 10/18/2019   HNP (herniated nucleus pulposus), lumbar 08/09/2019   Primary osteoarthritis of both first carpometacarpal joints 08/09/2019   OAB (overactive bladder) 08/09/2019   Mixed hyperlipidemia 08/09/2019   Macular hole of right eye 08/22/2017   Pseudophakia of both eyes 08/22/2017    No Known Allergies  Past Surgical History:  Procedure Laterality Date   ABDOMINAL HYSTERECTOMY     BREAST BIOPSY Bilateral    neg   BUNIONECTOMY     CATARACT EXTRACTION     CESAREAN SECTION     FACIAL COSMETIC SURGERY     PARS PLANA VITRECTOMY W/ REPAIR OF MACULAR HOLE      Social History   Tobacco Use   Smoking status: Never   Smokeless tobacco: Never  Vaping Use   Vaping Use: Never used  Substance Use Topics   Alcohol use: Yes    Comment: social   Drug use: Never     Medication list has been reviewed and updated.  Current Meds  Medication Sig   Ascorbic Acid (VITAMIN C) 1000 MG tablet Take 1,000 mg by mouth daily.   atorvastatin (LIPITOR) 80 MG tablet Take 1 tablet (80 mg total) by mouth daily.   Cholecalciferol (VITAMIN D) 50 MCG (2000 UT) CAPS Take 1 capsule by mouth daily at 2 PM.   diazepam (VALIUM) 5 MG tablet Take 5 mg by mouth as needed for anxiety.   Multiple Vitamins-Minerals (PRESERVISION AREDS 2+MULTI VIT PO) Take 1 tablet by mouth daily.   traZODone (DESYREL) 150 MG tablet Take 2 tablets (300 mg total) by mouth at bedtime.       10/21/2021   10:19 AM 08/12/2021    4:14 PM 01/04/2021    1:41 PM 10/19/2020    9:58 AM  GAD 7 : Generalized Anxiety Score  Nervous, Anxious, on Edge _0 Control/stop worrying 0 _1 Worry too much - different things _2 Trouble relaxing 0 1 0 0  Restless 0 0 0 0  Easily annoyed or irritable 0 0 0 2  Afraid - awful  might happen 2 0 0 0  Total GAD 7 Score _3 Anxiety Difficulty Not difficult at all Not difficult at all Not difficult at all Not difficult at all       10/21/2021   10:19 AM 08/12/2021    4:14 PM 03/06/2021   10:17 AM  Depression screen PHQ 2/9  Decreased Interest 0 0 0  Down, Depressed, Hopeless 0 0 0  PHQ - 2 Score 0 0 0  Altered sleeping 2 2   Tired, decreased energy 0 2   Change in appetite 0 0   Feeling bad or failure about yourself  0 0   Trouble concentrating 0 0   Moving slowly or fidgety/restless 0 0   Suicidal thoughts 0 0   PHQ-9 Score 2 4   Difficult doing work/chores Not difficult at all Not difficult at all  BP Readings from Last 3 Encounters:  10/21/21 118/68  08/12/21 122/84  08/08/21 110/76    Physical Exam Vitals and nursing note reviewed.  Constitutional:      General: She is not in acute distress.    Appearance: She is well-developed.  HENT:     Head: Normocephalic and atraumatic.     Right Ear: Tympanic membrane and ear canal normal.     Left Ear: Tympanic membrane and ear canal normal.     Nose:     Right Sinus: No maxillary sinus tenderness.     Left Sinus: No maxillary sinus tenderness.  Eyes:     General: No scleral icterus.       Right eye: No discharge.        Left eye: No discharge.     Conjunctiva/sclera: Conjunctivae normal.  Neck:     Thyroid: No thyromegaly.     Vascular: No carotid bruit.  Cardiovascular:     Rate and Rhythm: Normal rate and regular rhythm.     Pulses: Normal pulses.     Heart sounds: Normal heart sounds.  Pulmonary:     Effort: Pulmonary effort is normal. No respiratory distress.     Breath sounds: No wheezing.  Chest:  Breasts:    Right: No mass, nipple discharge, skin change or tenderness.     Left: No mass, nipple discharge, skin change or tenderness.  Abdominal:     General: Abdomen is flat. Bowel sounds are normal.     Palpations: Abdomen is soft. There is no hepatomegaly or splenomegaly.      Tenderness: There is no abdominal tenderness.  Musculoskeletal:     Cervical back: Normal range of motion. No erythema.     Right lower leg: No edema.     Left lower leg: No edema.  Lymphadenopathy:     Cervical: No cervical adenopathy.  Skin:    General: Skin is warm and dry.     Findings: No rash.  Neurological:     Mental Status: She is alert and oriented to person, place, and time.     Cranial Nerves: No cranial nerve deficit.     Sensory: No sensory deficit.     Deep Tendon Reflexes: Reflexes are normal and symmetric.  Psychiatric:        Attention and Perception: Attention normal.        Mood and Affect: Mood normal.     Wt Readings from Last 3 Encounters:  10/21/21 124 lb (56.2 kg)  08/12/21 126 lb (57.2 kg)  08/08/21 124 lb (56.2 kg)    BP 118/68   Pulse 77   Ht _0  (1.473 m)   Wt 124 lb (56.2 kg)   SpO2 92%   BMI 25.92 kg/m   Assessment and Plan: 1. Mixed hyperlipidemia Tolerating statin well. Being prescribed by Cardiology. - Lipid panel  2. Primary insomnia Continue Trazodone; Valium for interstate car travel. - diazepam (VALIUM) 5 MG tablet; Take 1 tablet (5 mg total) by mouth as needed for anxiety.  Dispense: 10 tablet; Refill: 0  3. Elevated glucose Check labs and rule out prediabetes - Comprehensive metabolic panel - Hemoglobin A1c  4. Encounter for screening mammogram for breast cancer Not due until January - will likely have moved by then. If she wants to return her for mammogram in January, call for an order.  5. Diarrhea, unspecified type Recurrent episodes lasting a full day without common inciting event, food, etc Will obtain GI profile to  evaluate for infection Colonoscopy done in 2019 was normal. - GI Profile, Stool, PCR   Partially dictated using Editor, commissioning. Any errors are unintentional.  Halina Maidens, MD [facility]  10/21/2021

## 2021-10-27 LAB — COMPREHENSIVE METABOLIC PANEL
ALT: 50 IU/L — ABNORMAL HIGH (ref 0–32)
AST: 25 IU/L (ref 0–40)
Albumin/Globulin Ratio: 2.2 (ref 1.2–2.2)
Albumin: 4.7 g/dL (ref 3.9–4.9)
Alkaline Phosphatase: 63 IU/L (ref 44–121)
BUN/Creatinine Ratio: 28 (ref 12–28)
BUN: 18 mg/dL (ref 8–27)
Bilirubin Total: 0.8 mg/dL (ref 0.0–1.2)
CO2: 23 mmol/L (ref 20–29)
Calcium: 8 mg/dL — ABNORMAL LOW (ref 8.7–10.3)
Chloride: 106 mmol/L (ref 96–106)
Creatinine, Ser: 0.64 mg/dL (ref 0.57–1.00)
Globulin, Total: 2.1 g/dL (ref 1.5–4.5)
Glucose: 92 mg/dL (ref 70–99)
Potassium: 4.3 mmol/L (ref 3.5–5.2)
Sodium: 144 mmol/L (ref 134–144)
Total Protein: 6.8 g/dL (ref 6.0–8.5)
eGFR: 96 mL/min/{1.73_m2} (ref >59)

## 2021-10-27 LAB — GI PROFILE, STOOL, PCR

## 2021-10-27 LAB — LIPID PANEL
Chol/HDL Ratio: 2.1 ratio (ref 0.0–4.4)
Cholesterol, Total: 142 mg/dL (ref 100–199)
HDL: 67 mg/dL (ref >39)
LDL Chol Calc (NIH): 63 mg/dL (ref 0–99)
Triglycerides: 53 mg/dL (ref 0–149)
VLDL Cholesterol Cal: 12 mg/dL (ref 5–40)

## 2021-10-27 LAB — HEMOGLOBIN A1C
Est. average glucose Bld gHb Est-mCnc: 117 mg/dL
Hgb A1c MFr Bld: 5.7 % — ABNORMAL HIGH (ref 4.8–5.6)

## 2021-10-28 ENCOUNTER — Telehealth: Payer: Self-pay

## 2021-10-28 NOTE — Telephone Encounter (Signed)
Called and left detailed message informing pt of this. Told her to call back with any questions.  - Shardai Star

## 2021-10-28 NOTE — Telephone Encounter (Signed)
Pt given lab results per notes of Dr. Army Melia on 10/28/21. Pt stated that her ALT is high and having diarrhea; and she suspects after internet research, that the elevated level might be due to the increased dosing of her statin. Pt stated she is concerned and wants to talk to her PCP about this.

## 2021-10-29 NOTE — Progress Notes (Signed)
Pt informed. See telephone encounter from 10/28/21. Jacqulyn Bath

## 2021-11-08 ENCOUNTER — Ambulatory Visit: Payer: Medicare Other | Admitting: Cardiology

## 2021-11-25 ENCOUNTER — Telehealth: Payer: Self-pay | Admitting: Cardiology

## 2021-11-25 NOTE — Telephone Encounter (Signed)
Patient returned call. I informed her, per previous message, fasting lab orders are still valid. No further questions.

## 2021-11-25 NOTE — Telephone Encounter (Signed)
Message left for pt to call back. MyChart message sent also.

## 2021-11-25 NOTE — Telephone Encounter (Signed)
Patient would like a call back to confirm her fasting lab orders are still valid.  Patient plans to do labs tomorrow.

## 2021-11-26 ENCOUNTER — Other Ambulatory Visit: Payer: Self-pay

## 2021-11-26 ENCOUNTER — Other Ambulatory Visit
Admission: RE | Admit: 2021-11-26 | Discharge: 2021-11-26 | Disposition: A | Discharge status: Home / Self Care | Payer: Medicare Other | Attending: Cardiology | Admitting: Cardiology

## 2021-11-26 DIAGNOSIS — E782 Mixed hyperlipidemia: Secondary | ICD-10-CM

## 2021-11-26 DIAGNOSIS — E7800 Pure hypercholesterolemia, unspecified: Secondary | ICD-10-CM | POA: Insufficient documentation

## 2021-11-26 LAB — HEPATIC FUNCTION PANEL
ALT: 32 U/L (ref 0–44)
AST: 22 U/L (ref 15–41)
Albumin: 4.6 g/dL (ref 3.5–5.0)
Alkaline Phosphatase: 44 U/L (ref 38–126)
Bilirubin, Direct: <0.1 mg/dL (ref 0.0–0.2)
Total Bilirubin: 1.1 mg/dL (ref 0.3–1.2)
Total Protein: 7.4 g/dL (ref 6.5–8.1)

## 2021-11-26 LAB — LIPID PANEL
Cholesterol: 183 mg/dL (ref 0–200)
HDL: 69 mg/dL (ref >40)
LDL Cholesterol: 105 mg/dL — ABNORMAL HIGH (ref 0–99)
Total CHOL/HDL Ratio: 2.7 RATIO
Triglycerides: 46 mg/dL (ref <150)
VLDL: 9 mg/dL (ref 0–40)

## 2021-11-29 ENCOUNTER — Ambulatory Visit: Payer: Medicare Other | Attending: Cardiology | Admitting: Cardiology

## 2021-11-29 ENCOUNTER — Encounter: Payer: Self-pay | Admitting: Cardiology

## 2021-11-29 VITALS — BP 142/78 | HR 86 | Ht 58.5 in | Wt 124.0 lb

## 2021-11-29 DIAGNOSIS — E7800 Pure hypercholesterolemia, unspecified: Secondary | ICD-10-CM

## 2021-11-29 MED ORDER — EZETIMIBE 10 MG PO TABS: 10.0000 mg | ORAL_TABLET | Freq: Every day | ORAL | 1 refills | Status: AC

## 2021-11-29 MED ORDER — ATORVASTATIN CALCIUM 40 MG PO TABS: 40.0000 mg | ORAL_TABLET | Freq: Every day | ORAL | 1 refills | Status: AC

## 2021-11-29 NOTE — Patient Instructions (Signed)
Medication Instructions:   Your physician has recommended you make the following change in your medication:    START taking Ezetimibe 10 MG once a day.  *If you need a refill on your cardiac medications before your next appointment, please call your pharmacy*    Follow-Up: At [facility], you and your health needs are our priority.  As part of our continuing mission to provide you with exceptional heart care, we have created designated Provider Care Teams.  These Care Teams include your primary Cardiologist (physician) and Advanced Practice Providers (APPs -  Physician Assistants and Nurse Practitioners) who all work together to provide you with the care you need, when you need it.  We recommend signing up for the patient portal called "MyChart".  Sign up information is provided on this After Visit Summary.  MyChart is used to connect with patients for Virtual Visits (Telemedicine).  Patients are able to view lab/test results, encounter notes, upcoming appointments, etc.  Non-urgent messages can be sent to your provider as well.   To learn more about what you can do with MyChart, go to [email or web address].    Your next appointment:    Will Establish Care in [place] in 4-6 weeks     Information About Sugar

## 2021-11-29 NOTE — Progress Notes (Signed)
Cardiology Office Note:    Date:  11/29/2021   ID:  Erin Yu, DOB 06-Jul-1950, MRN [id number]  PCP:  Reubin Milan, MD   [facility] Providers Cardiologist:  Debbe Odea, MD     Referring MD: Reubin Milan, MD   Chief Complaint  Patient presents with   Follow-up    3 month follow up, medication concerns     History of Present Illness:    Erin Yu is a 71 y.o. female with a hx of hyperlipidemia who presents for follow-up.  Previously seen for hyperlipidemia, Lipitor was titrated to 80 mg daily.  She developed diarrhea with increased dose of Lipitor, LFT was abnormal.  She cut back Lipitor to 40 with improvement in diarrhea.  Repeat liver panel showed normalization of LFT.  Planning on moving to [place] in about 4 weeks.  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   Cholecalciferol (VITAMIN D) 50 MCG (2000 UT) CAPS Take 1 capsule by mouth daily at 2 PM.   diazepam (VALIUM) 5 MG tablet Take 1 tablet (5 mg total) by mouth as needed for anxiety.   ezetimibe (ZETIA) 10 MG tablet Take 1 tablet (10 mg total) by mouth daily.   Multiple Vitamins-Minerals (PRESERVISION AREDS 2+MULTI VIT PO) Take 1 tablet by mouth daily.   traZODone (DESYREL) 150 MG tablet Take 2 tablets (300 mg total) by mouth at bedtime.   [DISCONTINUED] atorvastatin (LIPITOR) 80 MG tablet Take 1 tablet (80 mg total) by mouth daily. (Patient taking differently: Take 40 mg by mouth daily. Half  tablet)     Allergies:   Patient has no known allergies.   Social History   Socioeconomic History   Marital status: Married    Spouse name: Not on file   Number of children: 1   Years of education: Not on file   Highest education level: Not on file  Occupational History   Not on file  Tobacco Use   Smoking status: Never   Smokeless tobacco: Never  Vaping Use   Vaping Use: Never used  Substance and Sexual Activity   Alcohol use: Yes    Comment: social   Drug use: Never   Sexual activity: Not Currently  Other Topics Concern   Not on file  Social History Narrative   Not on file   Social Determinants of Health   Financial Resource Strain: Low Risk  (08/12/2021)   Overall Financial Resource Strain (CARDIA)    Difficulty of Paying Living Expenses: Not hard at all  Food Insecurity: No Food Insecurity (08/12/2021)   Hunger Vital Sign    Worried About Running Out of Food in the Last Year: Never true    Ran Out of Food in the Last Year: Never true  Transportation Needs: No Transportation Needs (08/12/2021)   PRAPARE - Transportation    Lack  of Transportation (Medical): No    Lack of Transportation (Non-Medical): No  Physical Activity: Sufficiently Active (03/06/2021)   Exercise Vital Sign    Days of Exercise per Week: 7 days    Minutes of Exercise per Session: 40 min  Stress: Stress Concern Present (03/06/2021)   [employer] of Occupational Health - Occupational Stress Questionnaire    Feeling of Stress : To some extent  Social Connections: Moderately Isolated (03/06/2021)   Social Connection and Isolation Panel [NHANES]    Frequency of Communication with Friends and Family: More than three times a week    Frequency of Social Gatherings with Friends and Family: Once a week    Attends Religious Services: Never    Database administrator or Organizations: No    Attends Engineer, structural: Never    Marital Status: Married     Family History: [name] family  history includes Depression in her mother; Diabetes in her father; Hearing loss in her mother; Heart disease in her father; Hyperlipidemia in her father; Hypertension in her father; Leukemia in her father. There is no history of Breast cancer.  ROS:   Please see the history of present illness.     All other systems reviewed and are negative.  EKGs/Labs/Other Studies Reviewed:    The following studies were reviewed today:   EKG:  EKG is ordered today.  EKG shows normal sinus rhythm, right bundle branch block.  Recent Labs: 08/12/2021: Hemoglobin 14.4; Platelets 290; TSH 1.990 10/24/2021: BUN 18; Creatinine, Ser 0.64; Potassium 4.3; Sodium 144 11/26/2021: ALT 32  Recent Lipid Panel    Component Value Date/Time   CHOL 183 11/26/2021 0948   CHOL 142 10/24/2021 0840   TRIG 46 11/26/2021 0948   HDL 69 11/26/2021 0948   HDL 67 10/24/2021 0840   CHOLHDL 2.7 11/26/2021 0948   VLDL 9 11/26/2021 0948   LDLCALC 105 (H) 11/26/2021 0948   LDLCALC 63 10/24/2021 0840     Risk Assessment/Calculations:          Physical Exam:    VS:  BP (!) 142/78 (BP Location: Left Arm, Patient Position: Sitting, Cuff Size: Normal)   Pulse 86   Ht 4' 10.5" (1.486 m)   Wt 124 lb (56.2 kg)   SpO2 98%   BMI 25.47 kg/m     Wt Readings from Last 3 Encounters:  11/29/21 124 lb (56.2 kg)  10/21/21 124 lb (56.2 kg)  08/12/21 126 lb (57.2 kg)     GEN:  Well nourished, well developed in no acute distress HEENT: Normal NECK: No JVD; No carotid bruits CARDIAC: RRR, no murmurs, rubs, gallops RESPIRATORY:  Clear to auscultation without rales, wheezing or rhonchi  ABDOMEN: Soft, non-tender, non-distended MUSCULOSKELETAL:  No edema; No deformity  SKIN: Warm and dry NEUROLOGIC:  Alert and oriented x 3 PSYCHIATRIC:  Normal affect   ASSESSMENT:    1. Pure hypercholesterolemia    PLAN:    In order of problems listed above:  Hyperlipidemia, cholesterol improving, but still elevated, did not tolerate  Lipitor 80, okay to reduce Lipitor to 40 mg daily.  Start Zetia.  Advised to establish care with PCP upon moving to [facility], obtain lipid panel with new provider..   Medication Adjustments/Labs and Tests Ordered: Current medicines are reviewed at length with the patient today.  Concerns regarding medicines are outlined above.  Orders Placed This Encounter  Procedures   EKG 12-Lead     Meds ordered this encounter  Medications  ezetimibe (ZETIA) 10 MG tablet    Sig: Take 1 tablet (10 mg total) by mouth daily.    Dispense:  90 tablet    Refill:  1   atorvastatin (LIPITOR) 40 MG tablet    Sig: Take 1 tablet (40 mg total) by mouth daily.    Dispense:  90 tablet    Refill:  1      Patient Instructions  Medication Instructions:   Your physician has recommended you make the following change in your medication:    START taking Ezetimibe 10 MG once a day.  *If you need a refill on your cardiac medications before your next appointment, please call your pharmacy*    Follow-Up: At [facility], you and your health needs are our priority.  As part of our continuing mission to provide you with exceptional heart care, we have created designated Provider Care Teams.  These Care Teams include your primary Cardiologist (physician) and Advanced Practice Providers (APPs -  Physician Assistants and Nurse Practitioners) who all work together to provide you with the care you need, when you need it.  We recommend signing up for the patient portal called "MyChart".  Sign up information is provided on this After Visit Summary.  MyChart is used to connect with patients for Virtual Visits (Telemedicine).  Patients are able to view lab/test results, encounter notes, upcoming appointments, etc.  Non-urgent messages can be sent to your provider as well.   To learn more about what you can do with MyChart, go to [email or web address].    Your next appointment:    Will Establish Care in  [place] in 4-6 weeks     Information About Sugar         Signed, Kate Sable, MD  11/29/2021 4:51 PM    [place]

## 2021-12-16 ENCOUNTER — Telehealth: Payer: Self-pay | Admitting: Internal Medicine

## 2021-12-16 NOTE — Telephone Encounter (Signed)
Copied from CRM [id number]. Topic: General - Other >> Dec 16, 2021  2:20 PM Lyman Speller wrote: Reason for CRM: Pt received a bill for DOS 9.28.23 for [facility] / the total bill cost was $755.20 / but medicare is rejecting the stool sample which is $650 / they are saying it is medically neccessary / pt asked to speak with the nurse or office manger about this matter / they advised her that this is a coding issue / pt email is [email or web address] / if pt doesn't doesn't answer please leave a detailed message or send an email

## 2022-01-24 NOTE — Telephone Encounter (Signed)
The patient spoke with Medicare and they told her that her provider needs to send additional information to them indicating that it was medically necessary. Please assist patient further as she is just stressing over this $650.00 bill that should be covered due to it being medically necessary.

## 2022-01-28 ENCOUNTER — Encounter: Payer: Self-pay | Admitting: Internal Medicine

## 2022-02-17 ENCOUNTER — Telehealth: Payer: Self-pay | Admitting: Internal Medicine

## 2022-02-17 NOTE — Telephone Encounter (Signed)
Copied from [place] [phone]. Topic: Medicare AWV >> Feb 17, 2022  1:40 PM Devoria Glassing wrote: Reason for CRM: Left message tor patient to schedule Medicare Annual Wellness Visit (AWV) with [facility] Health Advisor.  Appointment can be an offiice/telephone or virtual visit;  Please call [phone] ask for [facility].

## 2022-03-05 ENCOUNTER — Telehealth: Payer: Self-pay | Admitting: Internal Medicine

## 2022-03-05 NOTE — Telephone Encounter (Signed)
Copied from [place] [id number]. Topic: Medicare AWV >> Mar 05, 2022  9:49 AM Devoria Glassing wrote: Reason for CRM: Left message tor patient to schedule Medicare Annual Wellness Visit (AWV) with [place], [place]  Appointment can be an offiice/telephone or virtual visit;  Please call [phone] ask for Juliann Pulse.

## 2022-03-07 ENCOUNTER — Telehealth: Payer: Self-pay | Admitting: Internal Medicine

## 2022-03-07 NOTE — Telephone Encounter (Signed)
Copied from [place] [phone]. Topic: Medicare AWV >> Mar 07, 2022  2:00 PM Devoria Glassing wrote: Reason for CRM: Left message tor patient to schedule Medicare Annual Wellness Visit (AWV) with [place], [place]  Appointment can be an offiice/telephone or virtual visit;  Please call [phone] ask for Juliann Pulse.

## 2022-08-19 ENCOUNTER — Telehealth: Payer: Self-pay | Admitting: Internal Medicine

## 2022-08-19 NOTE — Telephone Encounter (Signed)
Copied from CRM [id number]. Topic: Medicare AWV >> Aug 19, 2022 10:30 AM Payton Doughty wrote: Reason for CRM: LM 08/19/2022 to schedule AWV   Verlee Rossetti; Care Guide Ambulatory Clinical Support [place] [facility] Direct Dial: [phone]

## 2023-11-29 IMAGING — MG MM DIGITAL SCREENING BILAT W/ TOMO AND CAD
8 series · 8 of 24 positions shown · non-contrast
Comparison: Previous exam(s).

CLINICAL DATA: Screening.

EXAM:
DIGITAL SCREENING BILATERAL MAMMOGRAM WITH TOMOSYNTHESIS AND CAD
TECHNIQUE: Bilateral screening digital craniocaudal and mediolateral oblique
mammograms were obtained. Bilateral screening digital breast
tomosynthesis was performed. The images were evaluated with
computer-aided detection.

[L MLO synth-2D]
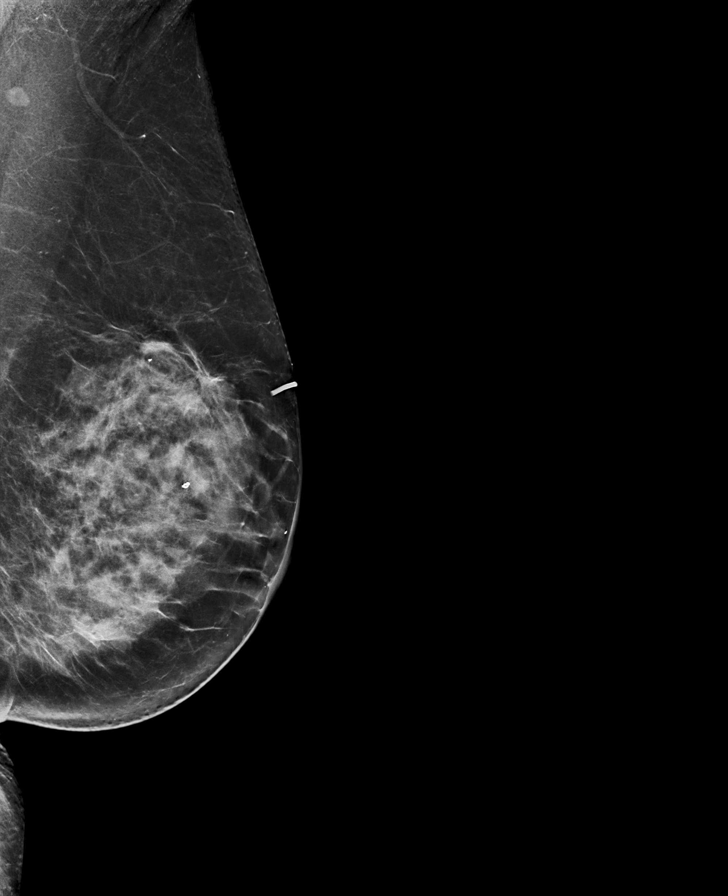

[R CC synth-2D]
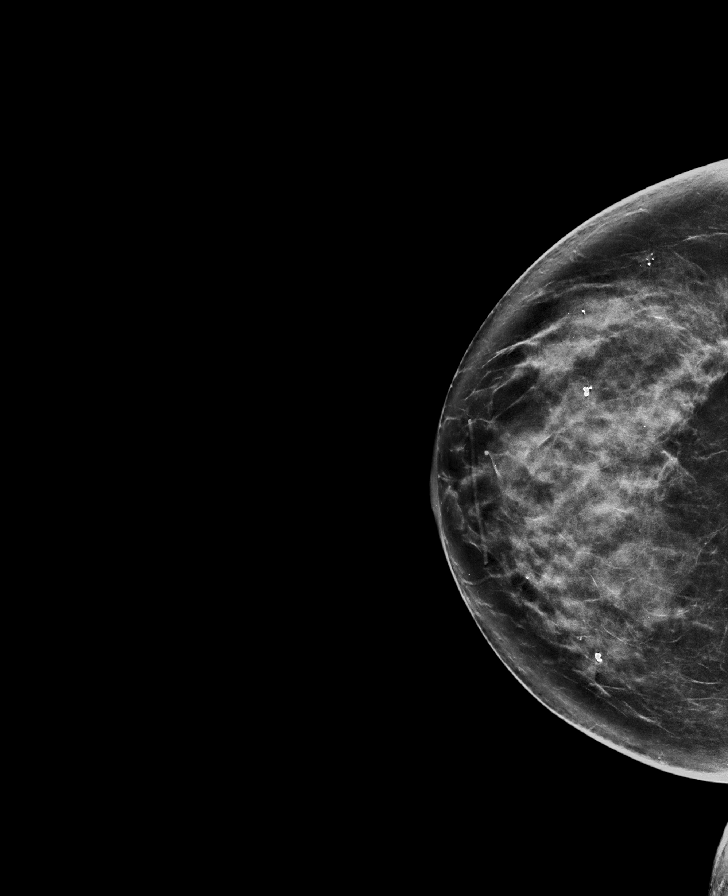

[R MLO synth-2D]
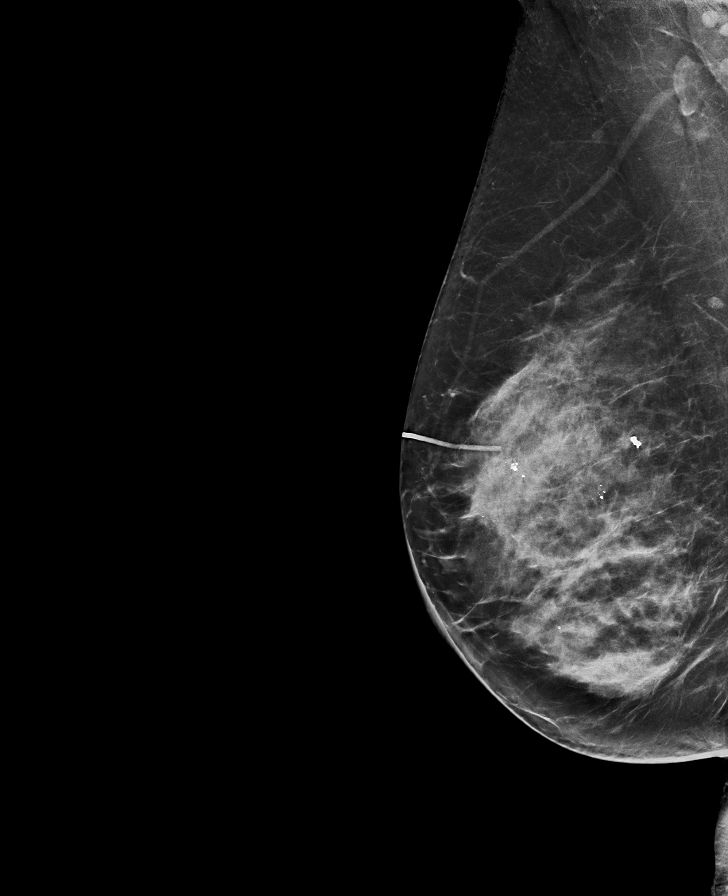

[L CC synth-2D]
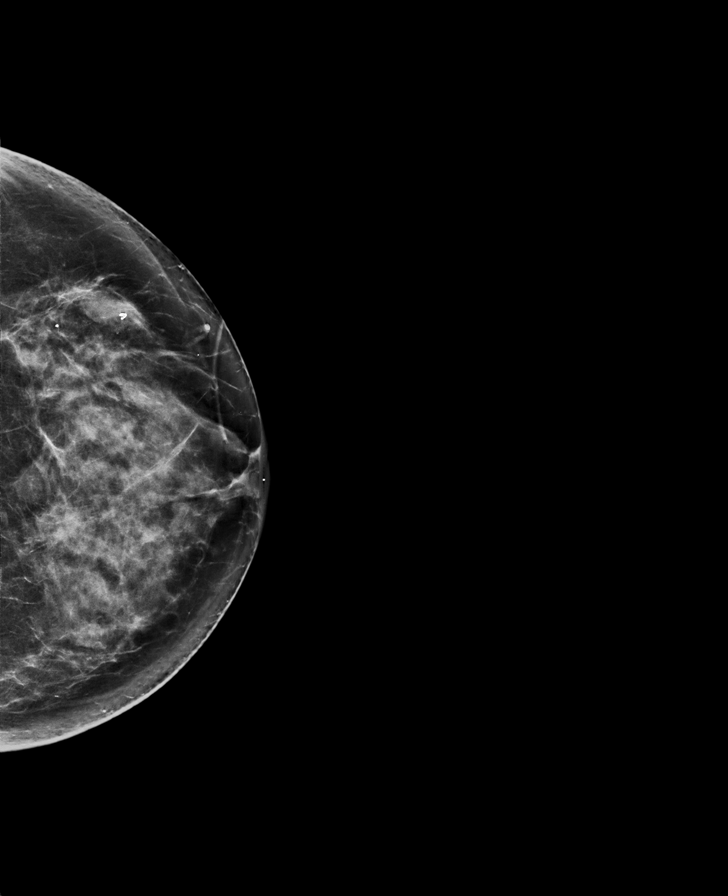

[L CC tomo · tomo slice 41/82.0]
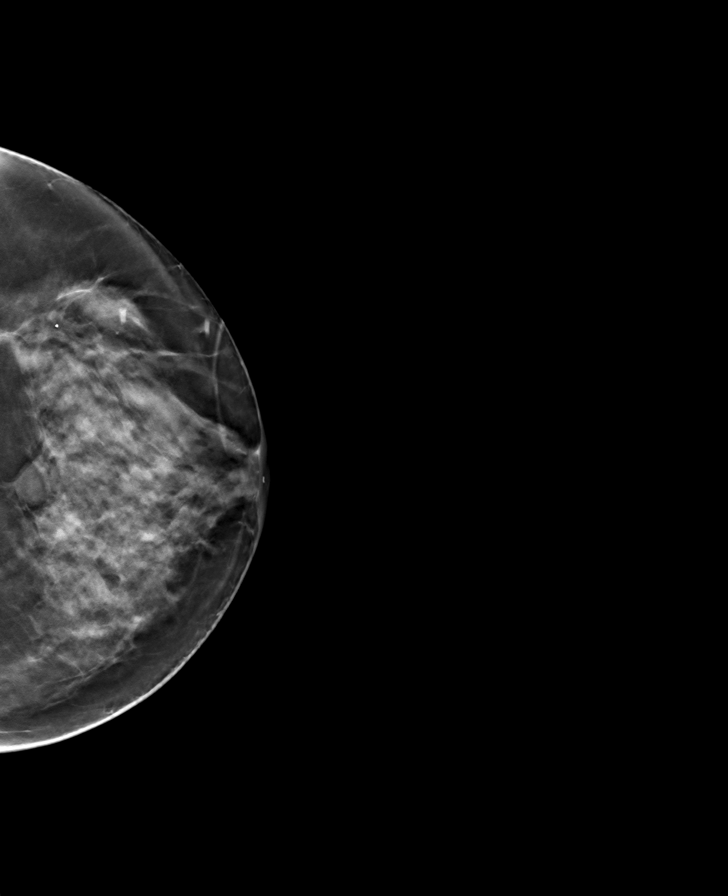

[R CC tomo · tomo slice 45/88.0]
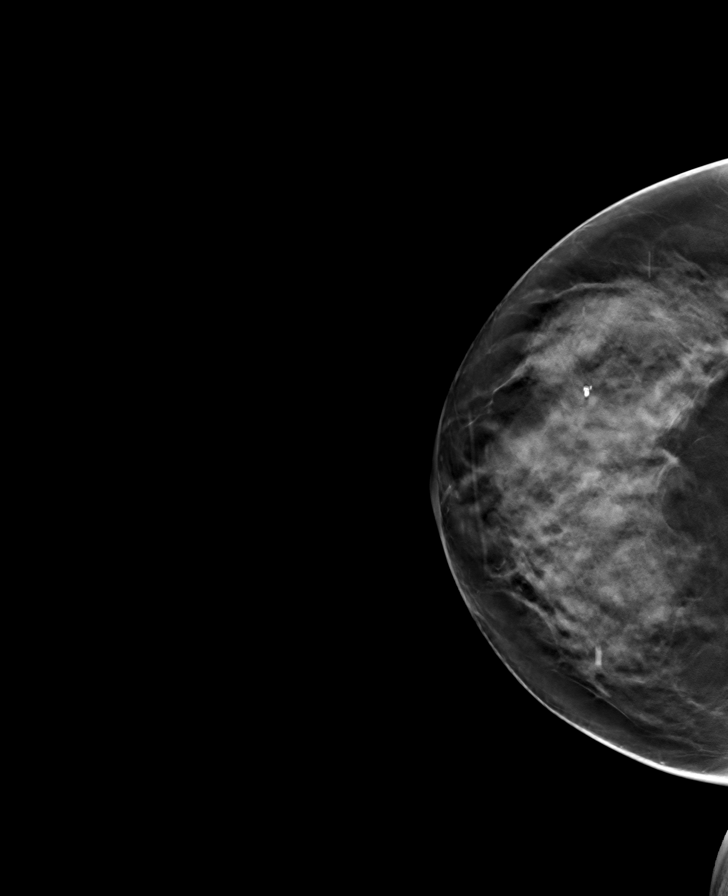

[L MLO tomo · tomo slice 40/79.0]
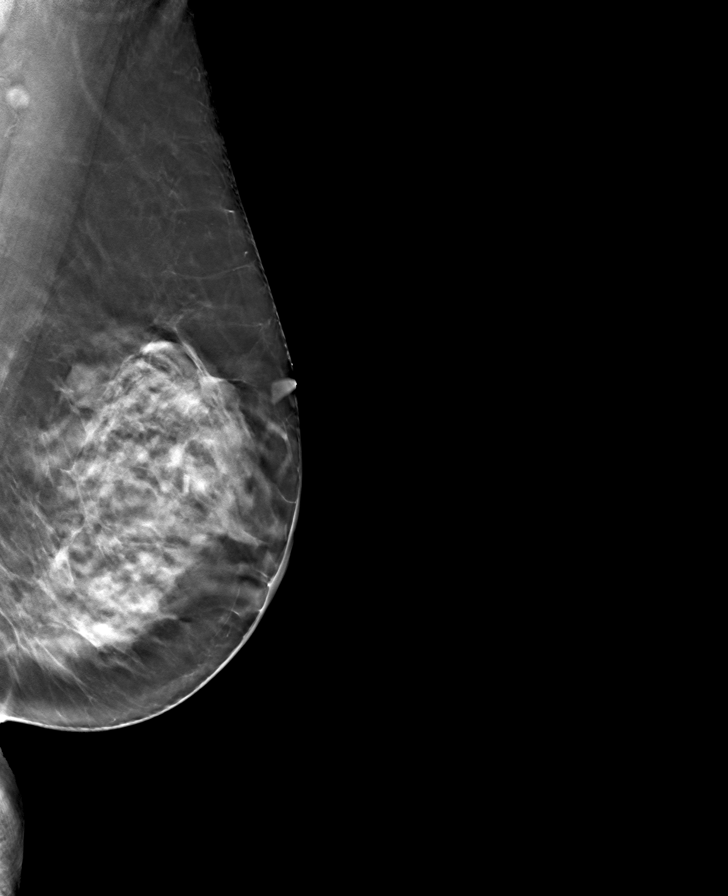

[R MLO tomo · tomo slice 42/83.0]
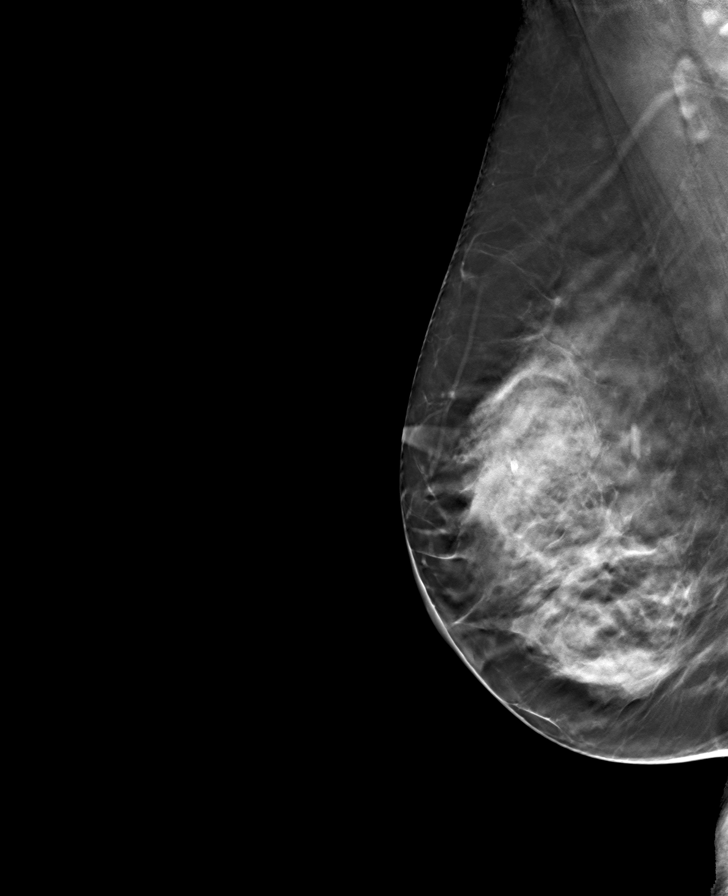

[8 of 24 positions shown; findings below may reference images not displayed]

ACR Breast Density Category d: The breast tissue is extremely dense,
which lowers the sensitivity of mammography
FINDINGS: There are no findings suspicious for malignancy.
IMPRESSION: No mammographic evidence of malignancy. A result letter of this
screening mammogram will be mailed directly to the patient.

RECOMMENDATION:
Screening mammogram in one year. (Code:TA-V-WV9)

BI-RADS CATEGORY  1: Negative.
